# Patient Record
Sex: Female | Born: 1970 | Race: White | Hispanic: No | Marital: Married | Smoking: Current some day smoker
Health system: Southern US, Community
[De-identification: ages and names within clinical notes are randomized; demographics above are authoritative.]

## PROBLEM LIST (undated history)

## (undated) DIAGNOSIS — Z87442 Personal history of urinary calculi: Secondary | ICD-10-CM

## (undated) DIAGNOSIS — K259 Gastric ulcer, unspecified as acute or chronic, without hemorrhage or perforation: Secondary | ICD-10-CM

## (undated) DIAGNOSIS — E669 Obesity, unspecified: Secondary | ICD-10-CM

## (undated) HISTORY — DX: Obesity, unspecified: E66.9

## (undated) HISTORY — DX: Gastric ulcer, unspecified as acute or chronic, without hemorrhage or perforation: K25.9

## (undated) HISTORY — PX: KIDNEY STONE SURGERY: SHX686

## (undated) HISTORY — DX: Personal history of urinary calculi: Z87.442

---

## 2007-02-02 ENCOUNTER — Ambulatory Visit: Payer: Self-pay

## 2007-11-08 ENCOUNTER — Emergency Department: Payer: Self-pay | Admitting: Emergency Medicine

## 2007-11-12 ENCOUNTER — Ambulatory Visit: Payer: Self-pay | Admitting: Urology

## 2011-07-18 ENCOUNTER — Ambulatory Visit: Payer: Self-pay

## 2012-07-03 ENCOUNTER — Ambulatory Visit: Payer: Self-pay | Admitting: Medical

## 2012-07-21 ENCOUNTER — Ambulatory Visit: Payer: Self-pay

## 2012-07-29 ENCOUNTER — Ambulatory Visit: Payer: Self-pay

## 2012-12-17 ENCOUNTER — Ambulatory Visit: Payer: Self-pay | Admitting: Internal Medicine

## 2012-12-17 LAB — RAPID INFLUENZA A&B ANTIGENS

## 2012-12-21 ENCOUNTER — Ambulatory Visit: Payer: Self-pay

## 2013-07-30 ENCOUNTER — Ambulatory Visit: Payer: Self-pay

## 2013-10-20 IMAGING — CR DG CHEST 2V
1 series · 2 of 2 positions shown · non-contrast
Comparison: none

REASON FOR EXAM: cough
COMMENTS:

PROCEDURE:     MDR - MDR CHEST PA(OR AP) AND LATERAL  - December 21, 2012  [DATE]
RESULT:     Comparison: None.

[Series 1: pa · 0.17mm/px · 2 of 2 slices shown]
[im 1/2]
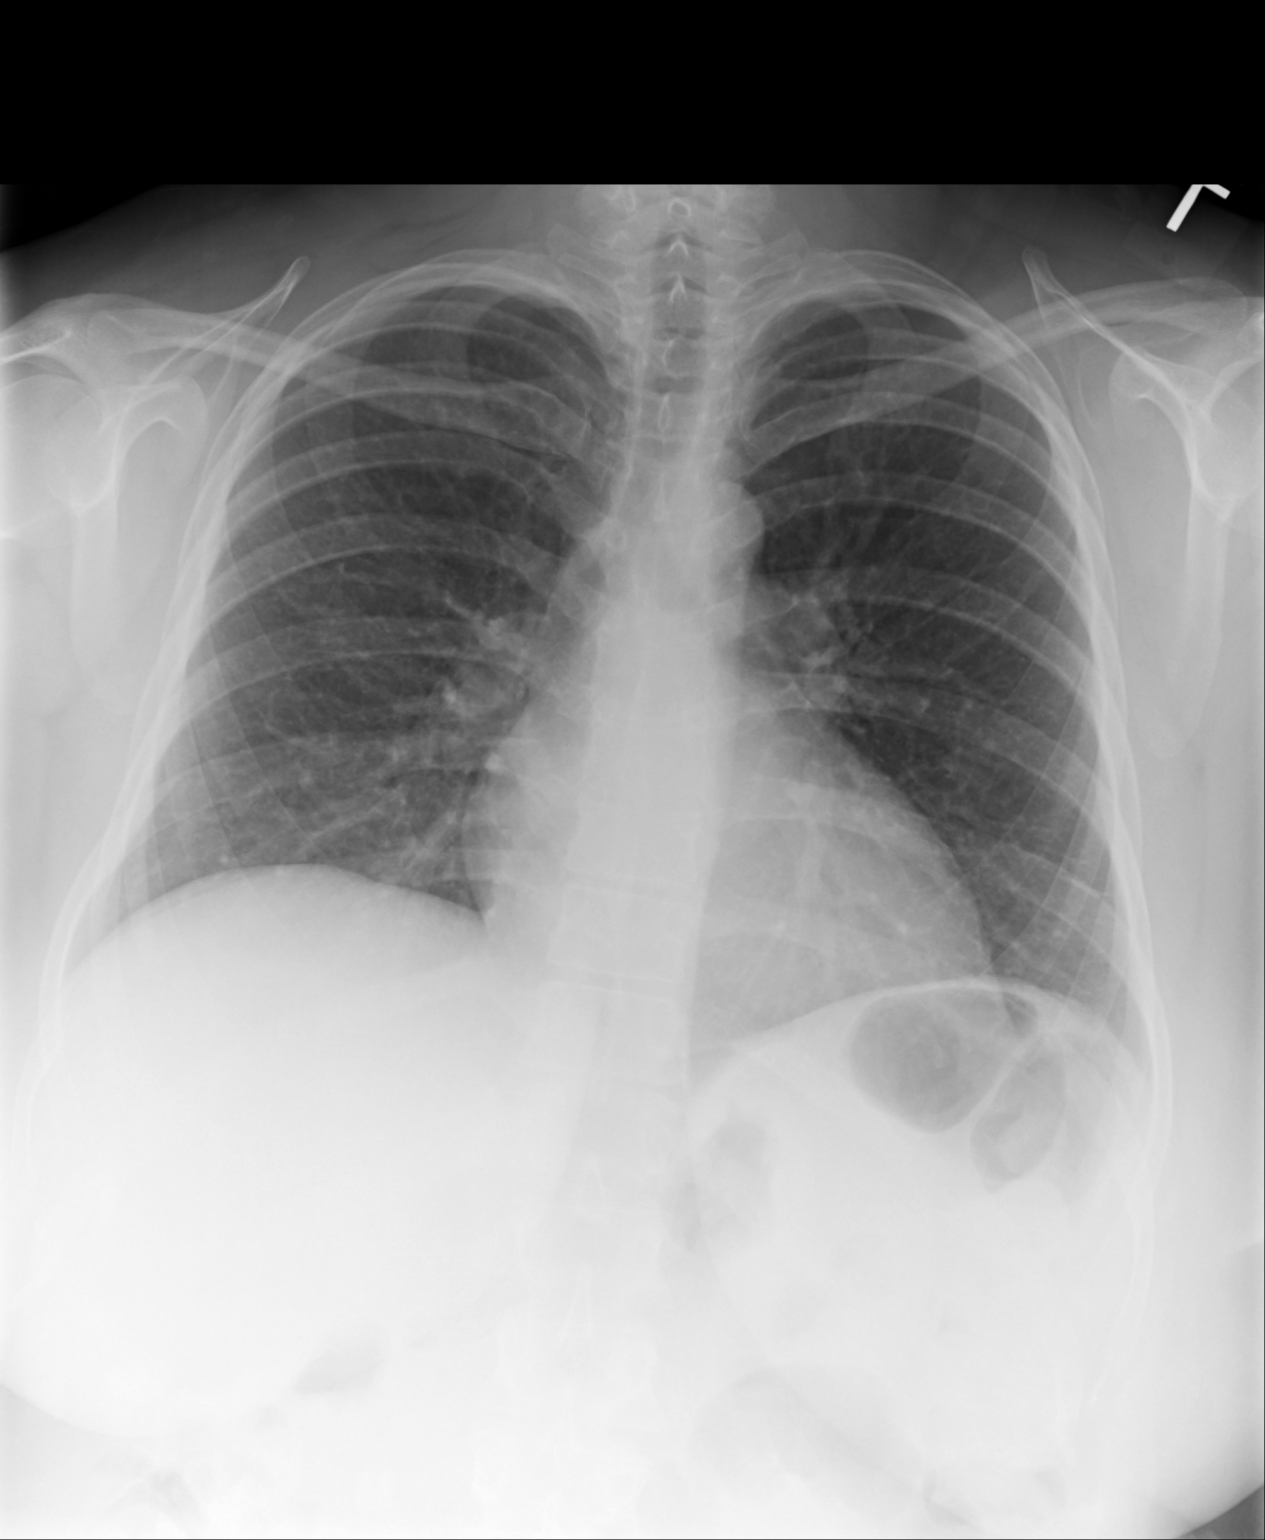
[im 2/2]
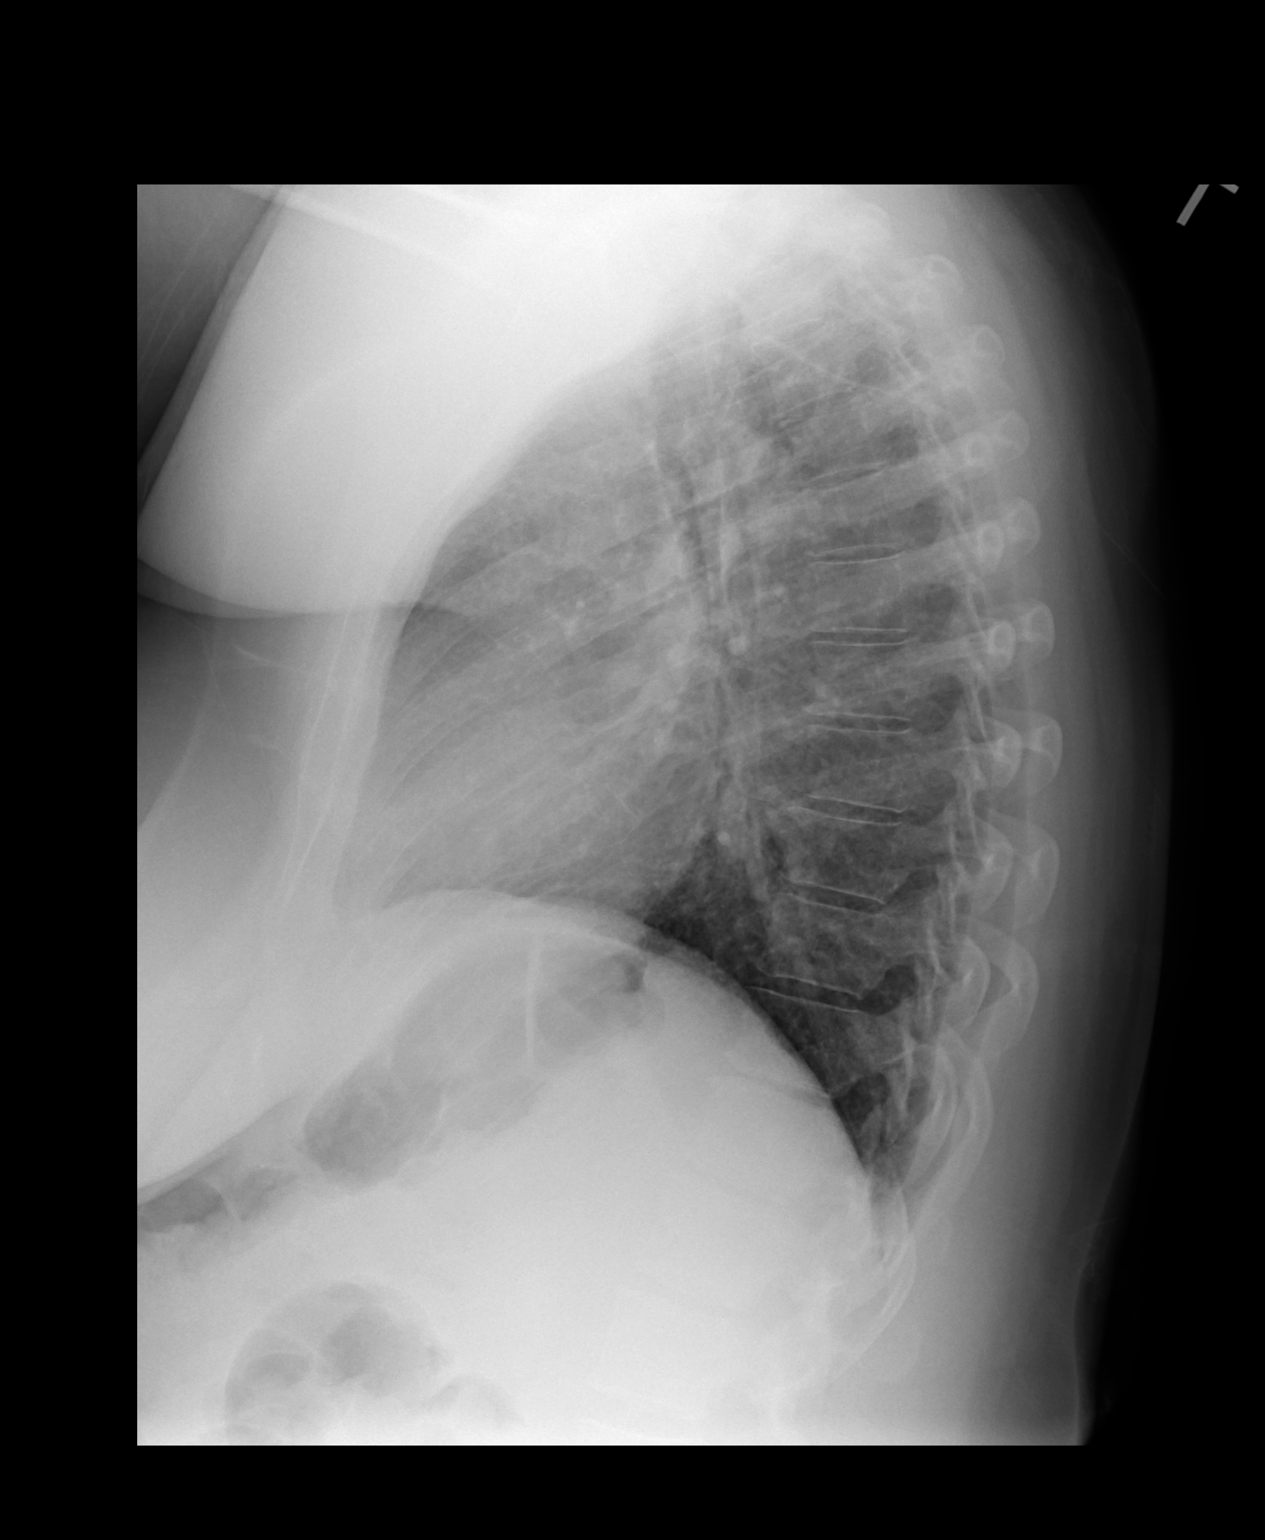

[2 of 2 positions shown; findings below may reference images not displayed]

FINDINGS: Heart size upper limits normal. The lung volumes are slightly diminished.
Minimal basilar opacities are likely secondary to atelectasis.
IMPRESSION: Minimal basilar opacities are likely secondary to atelectasis.

[REDACTED]

## 2014-08-18 ENCOUNTER — Ambulatory Visit: Payer: Self-pay

## 2016-07-18 ENCOUNTER — Ambulatory Visit (INDEPENDENT_AMBULATORY_CARE_PROVIDER_SITE_OTHER): Payer: BC Managed Care – PPO | Admitting: Family Medicine

## 2016-07-18 ENCOUNTER — Encounter: Payer: Self-pay | Admitting: Family Medicine

## 2016-07-18 VITALS — BP 133/84 | HR 98 | Temp 98.9°F | Ht 60.7 in | Wt 240.0 lb

## 2016-07-18 DIAGNOSIS — R5382 Chronic fatigue, unspecified: Secondary | ICD-10-CM | POA: Diagnosis not present

## 2016-07-18 NOTE — Assessment & Plan Note (Signed)
Likely multifactorial with mood and weight playing a factor. Concern for OSA. Will check labs at her physical shortly and obtain PHQ9. If no better and normal labs, consider sleep study. Continue to monitor closely. Call with any concerns.

## 2016-07-18 NOTE — Progress Notes (Signed)
BP 133/84 (BP Location: Left Arm, Patient Position: Sitting, Cuff Size: Large)   Pulse 98   Temp 98.9 F (37.2 C)   Ht 5' 0.7" (1.542 m)   Wt 240 lb (108.9 kg)   LMP 06/28/2016 (Approximate)   SpO2 98%   BMI 45.80 kg/m    Subjective:    Patient ID: Tanya Haas, female    DOB: 03/22/1971, 45 y.o.   MRN: [id number]  HPI: Tanya FendtBethany Jane Scarfone is a 45 y.o. female  Chief Complaint  Patient presents with  . Establish Care   FATIGUE Duration:  Couple of months Severity: moderate  Onset: gradual Context when symptoms started:  Gain a bit of weight Symptoms improve with rest: yes  Depressive symptoms: yes Stress/anxiety: yes-  Insomnia: no  Snoring: yes Observed apnea by bed partner: no Daytime hypersomnolence:no Wakes feeling refreshed: yes History of sleep study: no Dysnea on exertion:  yes Orthopnea/PND: no Chest pain: no Chronic cough: no Lower extremity edema: no Arthralgias:no Myalgias: no Weakness: no Rash: no  Active Ambulatory Problems    Diagnosis Date Noted  . Chronic fatigue 07/18/2016   Resolved Ambulatory Problems    Diagnosis Date Noted  . No Resolved Ambulatory Problems   Past Medical History:  Diagnosis Date  . History of kidney stones   . Obesity   . Stomach ulcer    Past Surgical History:  Procedure Laterality Date  . KIDNEY STONE SURGERY     No outpatient encounter prescriptions on file as of 07/18/2016.   No facility-administered encounter medications on file as of 07/18/2016.    No Known Allergies  Social History   Social History  . Marital status: Married    Spouse name: N/A  . Number of children: N/A  . Years of education: N/A   Occupational History  . Not on file.   Social History Main Topics  . Smoking status: Never Smoker  . Smokeless tobacco: Never Used  . Alcohol use No  . Drug use: No  . Sexual activity: Yes    Birth control/ protection: None, Other-see comments     Comment: Vasectomy for husband    Other Topics Concern  . Not on file   Social History Narrative  . No narrative on file   Family History  Problem Relation Age of Onset  . Cancer Mother     Uterine  . Heart disease Father   . Diabetes Maternal Grandmother   . Heart disease Maternal Grandmother   . Heart disease Paternal Grandmother   . AAA (abdominal aortic aneurysm) Paternal Grandfather    Review of Systems  Constitutional: Positive for fatigue. Negative for activity change, appetite change, chills, diaphoresis, fever and unexpected weight change.  Respiratory: Negative.   Cardiovascular: Negative.   Musculoskeletal: Positive for back pain and myalgias. Negative for arthralgias, gait problem, joint swelling, neck pain and neck stiffness.  Skin: Negative.   Psychiatric/Behavioral: Negative.     Per HPI unless specifically indicated above     Objective:    BP 133/84 (BP Location: Left Arm, Patient Position: Sitting, Cuff Size: Large)   Pulse 98   Temp 98.9 F (37.2 C)   Ht 5' 0.7" (1.542 m)   Wt 240 lb (108.9 kg)   LMP 06/28/2016 (Approximate)   SpO2 98%   BMI 45.80 kg/m   Wt Readings from Last 3 Encounters:  07/18/16 240 lb (108.9 kg)    Physical Exam  Constitutional: She is oriented to person, place, and time. She  appears well-developed and well-nourished. No distress.  HENT:  Head: Normocephalic and atraumatic.  Right Ear: Hearing normal.  Left Ear: Hearing normal.  Nose: Nose normal.  Eyes: Conjunctivae and lids are normal. Right eye exhibits no discharge. Left eye exhibits no discharge. No scleral icterus.  Cardiovascular: Normal rate, regular rhythm, normal heart sounds and intact distal pulses.  Exam reveals no gallop and no friction rub.   No murmur heard. Pulmonary/Chest: Effort normal and breath sounds normal. No respiratory distress. She has no wheezes. She has no rales. She exhibits no tenderness.  Musculoskeletal: Normal range of motion.  Neurological: She is alert and oriented  to person, place, and time.  Skin: Skin is warm, dry and intact. No rash noted. No erythema. No pallor.  Psychiatric: She has a normal mood and affect. Her speech is normal and behavior is normal. Judgment and thought content normal. Cognition and memory are normal.  Nursing note and vitals reviewed.      Assessment & Plan:   Problem List Items Addressed This Visit      Other   Chronic fatigue - Primary    Likely multifactorial with mood and weight playing a factor. Concern for OSA. Will check labs at her physical shortly and obtain PHQ9. If no better and normal labs, consider sleep study. Continue to monitor closely. Call with any concerns.        Other Visit Diagnoses   None.      Follow up plan: Return ASAP, for Physical.

## 2016-07-29 ENCOUNTER — Encounter: Payer: Self-pay | Admitting: Family Medicine

## 2016-07-29 ENCOUNTER — Ambulatory Visit (INDEPENDENT_AMBULATORY_CARE_PROVIDER_SITE_OTHER): Payer: BC Managed Care – PPO | Admitting: Family Medicine

## 2016-07-29 VITALS — BP 105/71 | HR 75 | Temp 98.6°F | Ht 60.5 in | Wt 240.5 lb

## 2016-07-29 DIAGNOSIS — Z23 Encounter for immunization: Secondary | ICD-10-CM | POA: Diagnosis not present

## 2016-07-29 DIAGNOSIS — Z0000 Encounter for general adult medical examination without abnormal findings: Secondary | ICD-10-CM | POA: Diagnosis not present

## 2016-07-29 DIAGNOSIS — R5382 Chronic fatigue, unspecified: Secondary | ICD-10-CM | POA: Diagnosis not present

## 2016-07-29 DIAGNOSIS — Z1231 Encounter for screening mammogram for malignant neoplasm of breast: Secondary | ICD-10-CM

## 2016-07-29 DIAGNOSIS — Z13220 Encounter for screening for lipoid disorders: Secondary | ICD-10-CM

## 2016-07-29 DIAGNOSIS — Z1239 Encounter for other screening for malignant neoplasm of breast: Secondary | ICD-10-CM

## 2016-07-29 NOTE — Assessment & Plan Note (Addendum)
Likely multifactorial with mood and weight playing a factor. Concern for OSA. Will check labs. If no better and normal labs, consider sleep study. Continue to monitor closely. Call with any concerns.

## 2016-07-29 NOTE — Progress Notes (Signed)
BP 105/71 (BP Location: Left Arm, Patient Position: Sitting, Cuff Size: Large)   Pulse 75   Temp 98.6 F (37 C)   Ht 5' 0.5" (1.537 m)   Wt 240 lb 8 oz (109.1 kg)   LMP 07/25/2016 (Exact Date)   SpO2 95%   BMI 46.20 kg/m    Subjective:    Patient ID: Tanya Haas, female    DOB: 03/11/1971, 45 y.o.   MRN: [id number]  HPI: Tanya FendtBethany Jane Pam is a 45 y.o. female presenting on 07/29/2016 for comprehensive medical examination. Current medical complaints include: Continues to be really tired and stressed about son's addiction.  She currently lives with: husband and kids Menopausal Symptoms: no  Depression Screen done today and results listed below:  Depression screen PHQ 2/9 07/29/2016  Decreased Interest 1  Down, Depressed, Hopeless 0  PHQ - 2 Score 1    Past Medical History:  Past Medical History:  Diagnosis Date  . History of kidney stones   . Obesity   . Stomach ulcer     Surgical History:  Past Surgical History:  Procedure Laterality Date  . KIDNEY STONE SURGERY     Medications:  No current outpatient prescriptions on file prior to visit.   No current facility-administered medications on file prior to visit.    Allergies:  No Known Allergies  Social History:  Social History   Social History  . Marital status: Married    Spouse name: N/A  . Number of children: N/A  . Years of education: N/A   Occupational History  . Not on file.   Social History Main Topics  . Smoking status: Never Smoker  . Smokeless tobacco: Never Used  . Alcohol use No  . Drug use: No  . Sexual activity: Yes    Birth control/ protection: None, Other-see comments     Comment: Vasectomy for husband   Other Topics Concern  . Not on file   Social History Narrative  . No narrative on file   History  Smoking Status  . Never Smoker  Smokeless Tobacco  . Never Used   History  Alcohol Use No   Family History:  Family History  Problem Relation Age of Onset  .  Cancer Mother     Uterine  . Heart disease Father   . Diabetes Maternal Grandmother   . Heart disease Maternal Grandmother   . Heart disease Paternal Grandmother   . AAA (abdominal aortic aneurysm) Paternal Grandfather    Past medical history, surgical history, medications, allergies, family history and social history reviewed with patient today and changes made to appropriate areas of the chart.   Review of Systems  Constitutional: Positive for malaise/fatigue. Negative for chills, diaphoresis, fever and weight loss.  HENT: Negative.   Eyes: Negative.   Respiratory: Negative.   Cardiovascular: Negative.   Gastrointestinal: Negative.   Genitourinary: Negative.   Musculoskeletal: Negative.   Skin: Negative.   Neurological: Negative.  Negative for weakness.  Endo/Heme/Allergies: Negative.   Psychiatric/Behavioral: Negative for depression, hallucinations, memory loss, substance abuse and suicidal ideas. The patient is nervous/anxious. The patient does not have insomnia.     All other ROS negative except what is listed above and in the HPI.     Objective:    BP 105/71 (BP Location: Left Arm, Patient Position: Sitting, Cuff Size: Large)   Pulse 75   Temp 98.6 F (37 C)   Ht 5' 0.5" (1.537 m)   Wt 240 lb 8 oz (  109.1 kg)   LMP 07/25/2016 (Exact Date)   SpO2 95%   BMI 46.20 kg/m   Wt Readings from Last 3 Encounters:  07/29/16 240 lb 8 oz (109.1 kg)  07/18/16 240 lb (108.9 kg)    Physical Exam  Constitutional: She is oriented to person, place, and time. She appears well-developed and well-nourished. No distress.  HENT:  Head: Normocephalic and atraumatic.  Right Ear: Hearing, tympanic membrane, external ear and ear canal normal.  Left Ear: Hearing, tympanic membrane, external ear and ear canal normal.  Nose: Nose normal.  Mouth/Throat: Uvula is midline, oropharynx is clear and moist and mucous membranes are normal. No oropharyngeal exudate.  Eyes: Conjunctivae, EOM and lids  are normal. Pupils are equal, round, and reactive to light. Right eye exhibits no discharge. Left eye exhibits no discharge. No scleral icterus.  Neck: Normal range of motion. Neck supple. No JVD present. No tracheal deviation present. No thyromegaly present.  Cardiovascular: Normal rate, regular rhythm, normal heart sounds and intact distal pulses.  Exam reveals no gallop and no friction rub.   No murmur heard. Pulmonary/Chest: Effort normal and breath sounds normal. No stridor. No respiratory distress. She has no wheezes. She has no rales. She exhibits no tenderness. Right breast exhibits no inverted nipple, no mass, no nipple discharge, no skin change and no tenderness. Left breast exhibits no inverted nipple, no mass, no nipple discharge, no skin change and no tenderness. Breasts are symmetrical.  Abdominal: Soft. Bowel sounds are normal. She exhibits no distension and no mass. There is no tenderness. There is no rebound and no guarding.  Genitourinary:  Genitourinary Comments: GYN exam deferred with shared decision making.   Musculoskeletal: Normal range of motion. She exhibits no edema, tenderness or deformity.  Lymphadenopathy:    She has no cervical adenopathy.  Neurological: She is alert and oriented to person, place, and time. She has normal reflexes. She displays normal reflexes. No cranial nerve deficit. She exhibits normal muscle tone. Coordination normal.  Skin: Skin is warm, dry and intact. No rash noted. She is not diaphoretic. No erythema. No pallor.  Psychiatric: She has a normal mood and affect. Her speech is normal and behavior is normal. Judgment and thought content normal. Cognition and memory are normal.  Nursing note and vitals reviewed.     Assessment & Plan:   Problem List Items Addressed This Visit      Other   Chronic fatigue    Likely multifactorial with mood and weight playing a factor. Concern for OSA. Will check labs. If no better and normal labs, consider sleep  study. Continue to monitor closely. Call with any concerns.       Relevant Orders   CBC with Differential/Platelet   Bayer DCA Hb A1c Waived   Comprehensive metabolic panel   Thyroid Panel With TSH   Lyme Ab/Western Blot Reflex   VITAMIN D 25 Hydroxy (Vit-D Deficiency, Fractures)   Rocky mtn spotted fvr abs pnl(IgG+IgM)   Babesia microti Antibody Panel   Ehrlichia Antibody Panel    Other Visit Diagnoses    Routine general medical examination at a health care facility    -  Primary   Vaccines updated/declined. Screening labs checked today. Pap due next year. Mammogram ordered today. Work on diet and exercise. Call with any concerns.    Relevant Orders   CBC with Differential/Platelet   Bayer DCA Hb A1c Waived   Comprehensive metabolic panel   Lipid Panel w/o Chol/HDL Ratio   UA/M  w/rflx Culture, Routine   Thyroid Panel With TSH   Screening for cholesterol level       Screening labs checked today. Await results.    Relevant Orders   Lipid Panel w/o Chol/HDL Ratio   Screening for breast cancer       Mammogram ordered today.   Relevant Orders   MM DIGITAL SCREENING BILATERAL   Immunization due       Tdap given today. flu declined.       Follow up plan: Return Pending labs .   LABORATORY TESTING:  - Pap smear: up to date  IMMUNIZATIONS:   - Tdap: Tetanus vaccination status reviewed: Tdap vaccination indicated and given today. - Influenza: Refused - Pneumovax: Not applicable  SCREENING: -Mammogram: Ordered today  - Colonoscopy: Not applicable   PATIENT COUNSELING:   Advised to take 1 mg of folate supplement per day if capable of pregnancy.   Sexuality: Discussed sexually transmitted diseases, partner selection, use of condoms, avoidance of unintended pregnancy  and contraceptive alternatives.   Advised to avoid cigarette smoking.  I discussed with the patient that most people either abstain from alcohol or drink within safe limits (<=14/week and <=4  drinks/occasion for males, <=7/weeks and <= 3 drinks/occasion for females) and that the risk for alcohol disorders and other health effects rises proportionally with the number of drinks per week and how often a drinker exceeds daily limits.  Discussed cessation/primary prevention of drug use and availability of treatment for abuse.   Diet: Encouraged to adjust caloric intake to maintain  or achieve ideal body weight, to reduce intake of dietary saturated fat and total fat, to limit sodium intake by avoiding high sodium foods and not adding table salt, and to maintain adequate dietary potassium and calcium preferably from fresh fruits, vegetables, and low-fat dairy products.    stressed the importance of regular exercise  Injury prevention: Discussed safety belts, safety helmets, smoke detector, smoking near bedding or upholstery.   Dental health: Discussed importance of regular tooth brushing, flossing, and dental visits.    NEXT PREVENTATIVE PHYSICAL DUE IN 1 YEAR. Return Pending labs .

## 2016-07-29 NOTE — Patient Instructions (Addendum)
Health Maintenance, Female Adopting a healthy lifestyle and getting preventive care can go a long way to promote health and wellness. Talk with your health care provider about what schedule of regular examinations is right for you. This is a good chance for you to check in with your provider about disease prevention and staying healthy. In between checkups, there are plenty of things you can do on your own. Experts have done a lot of research about which lifestyle changes and preventive measures are most likely to keep you healthy. Ask your health care provider for more information. WEIGHT AND DIET  Eat a healthy diet  Be sure to include plenty of vegetables, fruits, low-fat dairy products, and lean protein.  Do not eat a lot of foods high in solid fats, added sugars, or salt.  Get regular exercise. This is one of the most important things you can do for your health.  Most adults should exercise for at least 150 minutes each week. The exercise should increase your heart rate and make you sweat (moderate-intensity exercise).  Most adults should also do strengthening exercises at least twice a week. This is in addition to the moderate-intensity exercise.  Maintain a healthy weight  Body mass index (BMI) is a measurement that can be used to identify possible weight problems. It estimates body fat based on height and weight. Your health care provider can help determine your BMI and help you achieve or maintain a healthy weight.  For females 20 years of age and older:   A BMI below 18.5 is considered underweight.  A BMI of 18.5 to 24.9 is normal.  A BMI of 25 to 29.9 is considered overweight.  A BMI of 30 and above is considered obese.  Watch levels of cholesterol and blood lipids  You should start having your blood tested for lipids and cholesterol at 45 years of age, then have this test every 5 years.  You may need to have your cholesterol levels checked more often if:  Your lipid  or cholesterol levels are high.  You are older than 45 years of age.  You are at high risk for heart disease.  CANCER SCREENING   Lung Cancer  Lung cancer screening is recommended for adults 55-80 years old who are at high risk for lung cancer because of a history of smoking.  A yearly low-dose CT scan of the lungs is recommended for people who:  Currently smoke.  Have quit within the past 15 years.  Have at least a 30-pack-year history of smoking. A pack year is smoking an average of one pack of cigarettes a day for 1 year.  Yearly screening should continue until it has been 15 years since you quit.  Yearly screening should stop if you develop a health problem that would prevent you from having lung cancer treatment.  Breast Cancer  Practice breast self-awareness. This means understanding how your breasts normally appear and feel.  It also means doing regular breast self-exams. Let your health care provider know about any changes, no matter how small.  If you are in your 20s or 30s, you should have a clinical breast exam (CBE) by a health care provider every 1-3 years as part of a regular health exam.  If you are 40 or older, have a CBE every year. Also consider having a breast X-ray (mammogram) every year.  If you have a family history of breast cancer, talk to your health care provider about genetic screening.  If you   are at high risk for breast cancer, talk to your health care provider about having an MRI and a mammogram every year.  Breast cancer gene (BRCA) assessment is recommended for women who have family members with BRCA-related cancers. BRCA-related cancers include:  Breast.  Ovarian.  Tubal.  Peritoneal cancers.  Results of the assessment will determine the need for genetic counseling and BRCA1 and BRCA2 testing. Cervical Cancer Your health care provider may recommend that you be screened regularly for cancer of the pelvic organs (ovaries, uterus, and  vagina). This screening involves a pelvic examination, including checking for microscopic changes to the surface of your cervix (Pap test). You may be encouraged to have this screening done every 3 years, beginning at age 21.  For women ages 30-65, health care providers may recommend pelvic exams and Pap testing every 3 years, or they may recommend the Pap and pelvic exam, combined with testing for human papilloma virus (HPV), every 5 years. Some types of HPV increase your risk of cervical cancer. Testing for HPV may also be done on women of any age with unclear Pap test results.  Other health care providers may not recommend any screening for nonpregnant women who are considered low risk for pelvic cancer and who do not have symptoms. Ask your health care provider if a screening pelvic exam is right for you.  If you have had past treatment for cervical cancer or a condition that could lead to cancer, you need Pap tests and screening for cancer for at least 20 years after your treatment. If Pap tests have been discontinued, your risk factors (such as having a new sexual partner) need to be reassessed to determine if screening should resume. Some women have medical problems that increase the chance of getting cervical cancer. In these cases, your health care provider may recommend more frequent screening and Pap tests. Colorectal Cancer  This type of cancer can be detected and often prevented.  Routine colorectal cancer screening usually begins at 45 years of age and continues through 45 years of age.  Your health care provider may recommend screening at an earlier age if you have risk factors for colon cancer.  Your health care provider may also recommend using home test kits to check for hidden blood in the stool.  A small camera at the end of a tube can be used to examine your colon directly (sigmoidoscopy or colonoscopy). This is done to check for the earliest forms of colorectal  cancer.  Routine screening usually begins at age 50.  Direct examination of the colon should be repeated every 5-10 years through 45 years of age. However, you may need to be screened more often if early forms of precancerous polyps or small growths are found. Skin Cancer  Check your skin from head to toe regularly.  Tell your health care provider about any new moles or changes in moles, especially if there is a change in a mole's shape or color.  Also tell your health care provider if you have a mole that is larger than the size of a pencil eraser.  Always use sunscreen. Apply sunscreen liberally and repeatedly throughout the day.  Protect yourself by wearing long sleeves, pants, a wide-brimmed hat, and sunglasses whenever you are outside. HEART DISEASE, DIABETES, AND HIGH BLOOD PRESSURE   High blood pressure causes heart disease and increases the risk of stroke. High blood pressure is more likely to develop in:  People who have blood pressure in the high end   of the normal range (130-139/85-89 mm Hg).  People who are overweight or obese.  People who are African American.  If you are 38-23 years of age, have your blood pressure checked every 3-5 years. If you are 61 years of age or older, have your blood pressure checked every year. You should have your blood pressure measured twice--once when you are at a hospital or clinic, and once when you are not at a hospital or clinic. Record the average of the two measurements. To check your blood pressure when you are not at a hospital or clinic, you can use:  An automated blood pressure machine at a pharmacy.  A home blood pressure monitor.  If you are between 45 years and 39 years old, ask your health care provider if you should take aspirin to prevent strokes.  Have regular diabetes screenings. This involves taking a blood sample to check your fasting blood sugar level.  If you are at a normal weight and have a low risk for diabetes,  have this test once every three years after 45 years of age.  If you are overweight and have a high risk for diabetes, consider being tested at a younger age or more often. PREVENTING INFECTION  Hepatitis B  If you have a higher risk for hepatitis B, you should be screened for this virus. You are considered at high risk for hepatitis B if:  You were born in a country where hepatitis B is common. Ask your health care provider which countries are considered high risk.  Your parents were born in a high-risk country, and you have not been immunized against hepatitis B (hepatitis B vaccine).  You have HIV or AIDS.  You use needles to inject street drugs.  You live with someone who has hepatitis B.  You have had sex with someone who has hepatitis B.  You get hemodialysis treatment.  You take certain medicines for conditions, including cancer, organ transplantation, and autoimmune conditions. Hepatitis C  Blood testing is recommended for:  Everyone born from [birth year] through 1965.  Anyone with known risk factors for hepatitis C. Sexually transmitted infections (STIs)  You should be screened for sexually transmitted infections (STIs) including gonorrhea and chlamydia if:  You are sexually active and are younger than 45 years of age.  You are older than 45 years of age and your health care provider tells you that you are at risk for this type of infection.  Your sexual activity has changed since you were last screened and you are at an increased risk for chlamydia or gonorrhea. Ask your health care provider if you are at risk.  If you do not have HIV, but are at risk, it may be recommended that you take a prescription medicine daily to prevent HIV infection. This is called pre-exposure prophylaxis (PrEP). You are considered at risk if:  You are sexually active and do not regularly use condoms or know the HIV status of your partner(s).  You take drugs by injection.  You are sexually  active with a partner who has HIV. Talk with your health care provider about whether you are at high risk of being infected with HIV. If you choose to begin PrEP, you should first be tested for HIV. You should then be tested every 3 months for as long as you are taking PrEP.  PREGNANCY   If you are premenopausal and you may become pregnant, ask your health care provider about preconception counseling.  If you may  become pregnant, take 400 to 800 micrograms (mcg) of folic acid every day.  If you want to prevent pregnancy, talk to your health care provider about birth control (contraception). OSTEOPOROSIS AND MENOPAUSE   Osteoporosis is a disease in which the bones lose minerals and strength with aging. This can result in serious bone fractures. Your risk for osteoporosis can be identified using a bone density scan.  If you are 11 years of age or older, or if you are at risk for osteoporosis and fractures, ask your health care provider if you should be screened.  Ask your health care provider whether you should take a calcium or vitamin D supplement to lower your risk for osteoporosis.  Menopause may have certain physical symptoms and risks.  Hormone replacement therapy may reduce some of these symptoms and risks. Talk to your health care provider about whether hormone replacement therapy is right for you.  HOME CARE INSTRUCTIONS   Schedule regular health, dental, and eye exams.  Stay current with your immunizations.   Do not use any tobacco products including cigarettes, chewing tobacco, or electronic cigarettes.  If you are pregnant, do not drink alcohol.  If you are breastfeeding, limit how much and how often you drink alcohol.  Limit alcohol intake to no more than 1 drink per day for nonpregnant women. One drink equals 12 ounces of beer, 5 ounces of wine, or 1 ounces of hard liquor.  Do not use street drugs.  Do not share needles.  Ask your health care provider for help if  you need support or information about quitting drugs.  Tell your health care provider if you often feel depressed.  Tell your health care provider if you have ever been abused or do not feel safe at home.   This information is not intended to replace advice given to you by your health care provider. Make sure you discuss any questions you have with your health care provider.   Document Released: 04/01/2011 Document Revised: 10/07/2014 Document Reviewed: 08/18/2013 Elsevier Interactive Patient Education 2016 [employer]. Fatigue Fatigue is feeling tired all of the time, a lack of energy, or a lack of motivation. Occasional or mild fatigue is often a normal response to activity or life in general. However, long-lasting (chronic) or extreme fatigue may indicate an underlying medical condition. HOME CARE INSTRUCTIONS  Watch your fatigue for any changes. The following actions may help to lessen any discomfort you are feeling:  Talk to your health care provider about how much sleep you need each night. Try to get the required amount every night.  Take medicines only as directed by your health care provider.  Eat a healthy and nutritious diet. Ask your health care provider if you need help changing your diet.  Drink enough fluid to keep your urine clear or pale yellow.  Practice ways of relaxing, such as yoga, meditation, massage therapy, or acupuncture.  Exercise regularly.   Change situations that cause you stress. Try to keep your work and personal routine reasonable.  Do not abuse illegal drugs.  Limit alcohol intake to no more than 1 drink per day for nonpregnant women and 2 drinks per day for men. One drink equals 12 ounces of beer, 5 ounces of wine, or 1 ounces of hard liquor.  Take a multivitamin, if directed by your health care provider. SEEK MEDICAL CARE IF:   Your fatigue does not get better.  You have a fever.   You have unintentional weight loss or gain.  You have  headaches.   You have difficulty:   Falling asleep.  Sleeping throughout the night.  You feel angry, guilty, anxious, or sad.   You are unable to have a bowel movement (constipation).   You skin is dry.   Your legs or another part of your body is swollen.  SEEK IMMEDIATE MEDICAL CARE IF:   You feel confused.   Your vision is blurry.  You feel faint or pass out.   You have a severe headache.   You have severe abdominal, pelvic, or back pain.   You have chest pain, shortness of breath, or an irregular or fast heartbeat.   You are unable to urinate or you urinate less than normal.   You develop abnormal bleeding, such as bleeding from the rectum, vagina, nose, lungs, or nipples.  You vomit blood.   You have thoughts about harming yourself or committing suicide.   You are worried that you might harm someone else.    This information is not intended to replace advice given to you by your health care provider. Make sure you discuss any questions you have with your health care provider.   Document Released: 07/14/2007 Document Revised: 10/07/2014 Document Reviewed: 01/18/2014 Elsevier Interactive Patient Education 2016 [employer]. Tdap Vaccine (Tetanus, Diphtheria and Pertussis): What You Need to Know 1. Why get vaccinated? Tetanus, diphtheria and pertussis are very serious diseases. Tdap vaccine can protect [place] from these diseases. And, Tdap vaccine given to pregnant women can protect newborn babies against pertussis. TETANUS (Lockjaw) is rare in the [place] today. It causes painful muscle tightening and stiffness, usually all over the body.  It can lead to tightening of muscles in the head and neck so you can't open your mouth, swallow, or sometimes even breathe. Tetanus kills about 1 out of 10 people who are infected even after receiving the best medical care. DIPHTHERIA is also rare in the [place] today. It can cause a thick coating to  form in the back of the throat.  It can lead to breathing problems, heart failure, paralysis, and death. PERTUSSIS (Whooping Cough) causes severe coughing spells, which can cause difficulty breathing, vomiting and disturbed sleep.  It can also lead to weight loss, incontinence, and rib fractures. Up to 2 in 100 adolescents and 5 in 100 adults with pertussis are hospitalized or have complications, which could include pneumonia or death. These diseases are caused by bacteria. Diphtheria and pertussis are spread from person to person through secretions from coughing or sneezing. Tetanus enters the body through cuts, scratches, or wounds. Before vaccines, as many as 200,000 cases of diphtheria, 200,000 cases of pertussis, and hundreds of cases of tetanus, were reported in the [place] each year. Since vaccination began, reports of cases for tetanus and diphtheria have dropped by about 99% and for pertussis by about 80%. 2. Tdap vaccine Tdap vaccine can protect adolescents and adults from tetanus, diphtheria, and pertussis. One dose of Tdap is routinely given at age 6 or 28. People who did not get Tdap at that age should get it as soon as possible. Tdap is especially important for healthcare professionals and anyone having close contact with a baby younger than 12 months. Pregnant women should get a dose of Tdap during every pregnancy, to protect the newborn from pertussis. Infants are most at risk for severe, life-threatening complications from pertussis. Another vaccine, called Td, protects against tetanus and diphtheria, but not pertussis. A Td booster should be given every 10  years. Tdap may be given as one of these boosters if you have never gotten Tdap before. Tdap may also be given after a severe cut or burn to prevent tetanus infection. Your doctor or the person giving you the vaccine can give you more information. Tdap may safely be given at the same time as other vaccines. 3. Some people  should not get this vaccine  A person who has ever had a life-threatening allergic reaction after a previous dose of any diphtheria, tetanus or pertussis containing vaccine, OR has a severe allergy to any part of this vaccine, should not get Tdap vaccine. Tell the person giving the vaccine about any severe allergies.  Anyone who had coma or long repeated seizures within 7 days after a childhood dose of DTP or DTaP, or a previous dose of Tdap, should not get Tdap, unless a cause other than the vaccine was found. They can still get Td.  Talk to your doctor if you:  have seizures or another nervous system problem,  had severe pain or swelling after any vaccine containing diphtheria, tetanus or pertussis,  ever had a condition called Guillain-Barr Syndrome (GBS),  aren't feeling well on the day the shot is scheduled. 4. Risks With any medicine, including vaccines, there is a chance of side effects. These are usually mild and go away on their own. Serious reactions are also possible but are rare. Most people who get Tdap vaccine do not have any problems with it. Mild problems following Tdap (Did not interfere with activities)  Pain where the shot was given (about 3 in 4 adolescents or 2 in 3 adults)  Redness or swelling where the shot was given (about 1 person in 5)  Mild fever of at least 100.41F (up to about 1 in 25 adolescents or 1 in 100 adults)  Headache (about 3 or 4 people in 10)  Tiredness (about 1 person in 3 or 4)  Nausea, vomiting, diarrhea, stomach ache (up to 1 in 4 adolescents or 1 in 10 adults)  Chills, sore joints (about 1 person in 10)  Body aches (about 1 person in 3 or 4)  Rash, swollen glands (uncommon) Moderate problems following Tdap (Interfered with activities, but did not require medical attention)  Pain where the shot was given (up to 1 in 5 or 6)  Redness or swelling where the shot was given (up to about 1 in 16 adolescents or 1 in 12 adults)  Fever  over 102F (about 1 in 100 adolescents or 1 in 250 adults)  Headache (about 1 in 7 adolescents or 1 in 10 adults)  Nausea, vomiting, diarrhea, stomach ache (up to 1 or 3 people in 100)  Swelling of the entire arm where the shot was given (up to about 1 in 500). Severe problems following Tdap (Unable to perform usual activities; required medical attention)  Swelling, severe pain, bleeding and redness in the arm where the shot was given (rare). Problems that could happen after any vaccine:  People sometimes faint after a medical procedure, including vaccination. Sitting or lying down for about 15 minutes can help prevent fainting, and injuries caused by a fall. Tell your doctor if you feel dizzy, or have vision changes or ringing in the ears.  Some people get severe pain in the shoulder and have difficulty moving the arm where a shot was given. This happens very rarely.  Any medication can cause a severe allergic reaction. Such reactions from a vaccine are very rare, estimated at  fewer than 1 in a million doses, and would happen within a few minutes to a few hours after the vaccination. As with any medicine, there is a very remote chance of a vaccine causing a serious injury or death. The safety of vaccines is always being monitored. For more information, visit: http://www.aguilar.org/ 5. What if there is a serious problem? What should I look for?  Look for anything that concerns you, such as signs of a severe allergic reaction, very high fever, or unusual behavior.  Signs of a severe allergic reaction can include hives, swelling of the face and throat, difficulty breathing, a fast heartbeat, dizziness, and weakness. These would usually start a few minutes to a few hours after the vaccination. What should I do?  If you think it is a severe allergic reaction or other emergency that can't wait, call 9-1-1 or get the person to the nearest hospital. Otherwise, call your doctor.  Afterward,  the reaction should be reported to the Vaccine Adverse Event Reporting System (VAERS). Your doctor might file this report, or you can do it yourself through the VAERS web site at www.vaers.SamedayNews.es, or by calling 9401037939. VAERS does not give medical advice.  6. The National Vaccine Injury Compensation Program The [employer] Vaccine Injury Compensation Program (VICP) is a federal program that was created to compensate people who may have been injured by certain vaccines. Persons who believe they may have been injured by a vaccine can learn about the program and about filing a claim by calling 317-097-4826 or visiting the [place] website at GoldCloset.com.ee. There is a time limit to file a claim for compensation. 7. How can I learn more?  Ask your doctor. He or she can give you the vaccine package insert or suggest other sources of information.  Call your local or state health department.  Contact the Centers for Disease Control and Prevention (CDC):  Call 219 675 7044 (1-800-CDC-INFO) or  Visit CDC's website at http://hunter.com/ CDC Tdap Vaccine VIS (11/23/13)   This information is not intended to replace advice given to you by your health care provider. Make sure you discuss any questions you have with your health care provider.   Document Released: 03/17/2012 Document Revised: 10/07/2014 Document Reviewed: 12/29/2013 Elsevier Interactive Patient Education [employer].

## 2016-07-31 ENCOUNTER — Telehealth: Payer: Self-pay | Admitting: Family Medicine

## 2016-07-31 DIAGNOSIS — R5382 Chronic fatigue, unspecified: Secondary | ICD-10-CM

## 2016-07-31 LAB — COMPREHENSIVE METABOLIC PANEL
ALK PHOS: 50 IU/L (ref 39–117)
ALT: 11 IU/L (ref 0–32)
AST: 12 IU/L (ref 0–40)
Albumin/Globulin Ratio: 1.8 (ref 1.2–2.2)
Albumin: 4.3 g/dL (ref 3.5–5.5)
BUN/Creatinine Ratio: 16 (ref 9–23)
BUN: 14 mg/dL (ref 6–24)
Bilirubin Total: 0.7 mg/dL (ref 0.0–1.2)
CO2: 27 mmol/L (ref 18–29)
CREATININE: 0.86 mg/dL (ref 0.57–1.00)
Calcium: 9.4 mg/dL (ref 8.7–10.2)
Chloride: 99 mmol/L (ref 96–106)
GFR calc Af Amer: 94 mL/min/{1.73_m2} (ref >59)
GFR calc non Af Amer: 82 mL/min/{1.73_m2} (ref >59)
GLUCOSE: 88 mg/dL (ref 65–99)
Globulin, Total: 2.4 g/dL (ref 1.5–4.5)
Potassium: 4.8 mmol/L (ref 3.5–5.2)
SODIUM: 140 mmol/L (ref 134–144)
Total Protein: 6.7 g/dL (ref 6.0–8.5)

## 2016-07-31 LAB — CBC WITH DIFFERENTIAL/PLATELET
BASOS ABS: 0 10*3/uL (ref 0.0–0.2)
Basos: 0 %
EOS (ABSOLUTE): 0.1 10*3/uL (ref 0.0–0.4)
EOS: 2 %
HEMATOCRIT: 36 % (ref 34.0–46.6)
Hemoglobin: 12 g/dL (ref 11.1–15.9)
Immature Grans (Abs): 0 10*3/uL (ref 0.0–0.1)
Immature Granulocytes: 0 %
LYMPHS ABS: 2.1 10*3/uL (ref 0.7–3.1)
Lymphs: 28 %
MCH: 29.5 pg (ref 26.6–33.0)
MCHC: 33.3 g/dL (ref 31.5–35.7)
MCV: 89 fL (ref 79–97)
MONOCYTES: 5 %
MONOS ABS: 0.3 10*3/uL (ref 0.1–0.9)
NEUTROS ABS: 4.8 10*3/uL (ref 1.4–7.0)
Neutrophils: 65 %
Platelets: 387 10*3/uL — ABNORMAL HIGH (ref 150–379)
RBC: 4.07 x10E6/uL (ref 3.77–5.28)
RDW: 13.1 % (ref 12.3–15.4)
WBC: 7.4 10*3/uL (ref 3.4–10.8)

## 2016-07-31 LAB — BABESIA MICROTI ANTIBODY PANEL: Babesia microti IgM: <1:10 {titer}

## 2016-07-31 LAB — LIPID PANEL W/O CHOL/HDL RATIO
CHOLESTEROL TOTAL: 193 mg/dL (ref 100–199)
HDL: 48 mg/dL (ref >39)
LDL CALC: 92 mg/dL (ref 0–99)
TRIGLYCERIDES: 263 mg/dL — AB (ref 0–149)
VLDL Cholesterol Cal: 53 mg/dL — ABNORMAL HIGH (ref 5–40)

## 2016-07-31 LAB — UA/M W/RFLX CULTURE, ROUTINE
Bilirubin, UA: NEGATIVE
GLUCOSE, UA: NEGATIVE
Ketones, UA: NEGATIVE
Nitrite, UA: NEGATIVE
Protein, UA: NEGATIVE
SPEC GRAV UA: 1.025 (ref 1.005–1.030)
Urobilinogen, Ur: 1 mg/dL (ref 0.2–1.0)
pH, UA: 6.5 (ref 5.0–7.5)

## 2016-07-31 LAB — EHRLICHIA ANTIBODY PANEL
E. Chaffeensis (HME) IgM Titer: NEGATIVE
E.Chaffeensis (HME) IgG: NEGATIVE
HGE IGM TITER: NEGATIVE
HGE IgG Titer: NEGATIVE

## 2016-07-31 LAB — URINE CULTURE, REFLEX

## 2016-07-31 LAB — MICROSCOPIC EXAMINATION

## 2016-07-31 LAB — THYROID PANEL WITH TSH
FREE THYROXINE INDEX: 2 (ref 1.2–4.9)
T3 Uptake Ratio: 25 % (ref 24–39)
T4, Total: 7.9 ug/dL (ref 4.5–12.0)
TSH: 2.43 u[IU]/mL (ref 0.450–4.500)

## 2016-07-31 LAB — VITAMIN D 25 HYDROXY (VIT D DEFICIENCY, FRACTURES): VIT D 25 HYDROXY: 19.7 ng/mL — AB (ref 30.0–100.0)

## 2016-07-31 LAB — LYME AB/WESTERN BLOT REFLEX
LYME DISEASE AB, QUANT, IGM: <0.8 index (ref 0.00–0.79)
Lyme IgG/IgM Ab: <0.91 {ISR} (ref 0.00–0.90)

## 2016-07-31 LAB — ROCKY MTN SPOTTED FVR ABS PNL(IGG+IGM)
RMSF IGG: NEGATIVE
RMSF IgM: 0.43 index (ref 0.00–0.89)

## 2016-07-31 LAB — BAYER DCA HB A1C WAIVED: HB A1C: 5.3 % (ref <7.0)

## 2016-07-31 NOTE — Telephone Encounter (Signed)
Called patient, no answer, left message for patient to return my call.  

## 2016-07-31 NOTE — Telephone Encounter (Signed)
Please let her know that her labs all came back nice and normal except her vitamin D is a little low. I'd like her to start 1000IU D3 OTC, and that might help. However, it's likely not the main cause of her sleepiness, so I'd like to get a sleep study on her to make sure she's getting appropriately restful sleep. If she wants to talk to me, let me know, otherwise I'll fill out the paperwork as soon as she says it's OK. Thanks!

## 2016-08-01 ENCOUNTER — Telehealth: Payer: Self-pay | Admitting: Family Medicine

## 2016-08-01 NOTE — Telephone Encounter (Signed)
Pt was returning a call from Dr. Laural BenesJohnson about lab results.

## 2016-08-02 NOTE — Telephone Encounter (Signed)
Patient notified

## 2016-08-02 NOTE — Telephone Encounter (Signed)
Referral for sleep study placed ?

## 2016-08-02 NOTE — Telephone Encounter (Signed)
Patient notified about her lab results.  She is willing to do the sleep study. Will you please place the order?

## 2016-09-09 ENCOUNTER — Telehealth: Payer: BC Managed Care – PPO | Admitting: Family

## 2016-09-09 ENCOUNTER — Encounter: Payer: Self-pay | Admitting: Family Medicine

## 2016-09-09 DIAGNOSIS — B9689 Other specified bacterial agents as the cause of diseases classified elsewhere: Secondary | ICD-10-CM

## 2016-09-09 DIAGNOSIS — J019 Acute sinusitis, unspecified: Secondary | ICD-10-CM

## 2016-09-09 MED ORDER — PREDNISONE 10 MG (21) PO TBPK: 10.0000 mg | ORAL_TABLET | Freq: Every day | ORAL | 0 refills | Status: DC

## 2016-09-09 NOTE — Telephone Encounter (Signed)
Routing to provider  

## 2016-09-09 NOTE — Progress Notes (Signed)
We are sorry that you are not feeling well.  Here is how we plan to help!  Based on what you have shared with me it looks like you have upper respiratory tract inflammation that has resulted in a significant cough.  Inflammation and infection in the upper respiratory tract is commonly called bronchitis and has four common causes:  Allergies, Viral Infections, Acid Reflux and Bacterial Infections.  Allergies, viruses and acid reflux are treated by controlling symptoms or eliminating the cause. An example might be a cough caused by taking certain blood pressure medications. You stop the cough by changing the medication. Another example might be a cough caused by acid reflux. Controlling the reflux helps control the cough.  Based on your presentation I believe you most likely have A cough due to a virus.  This is called viral bronchitis and is best treated by rest, plenty of fluids and control of the cough.  You may use Ibuprofen or Tylenol as directed to help your symptoms.      Sterapred 10 mg dosepak  USE OF BRONCHODILATOR ("RESCUE") INHALERS: There is a risk from using your bronchodilator too frequently.  The risk is that over-reliance on a medication which only relaxes the muscles surrounding the breathing tubes can reduce the effectiveness of medications prescribed to reduce swelling and congestion of the tubes themselves.  Although you feel brief relief from the bronchodilator inhaler, your asthma may actually be worsening with the tubes becoming more swollen and filled with mucus.  This can delay other crucial treatments, such as oral steroid medications. If you need to use a bronchodilator inhaler daily, several times per day, you should discuss this with your provider.  There are probably better treatments that could be used to keep your asthma under control.     HOME CARE . Only take medications as instructed by your medical team. . Complete the entire course of an antibiotic. . Drink plenty  of fluids and get plenty of rest. . Avoid close contacts especially the very young and the elderly . Cover your mouth if you cough or cough into your sleeve. . Always remember to wash your hands . A steam or ultrasonic humidifier can help congestion.   GET HELP RIGHT AWAY IF: . You develop worsening fever. . You become short of breath . You cough up blood. . Your symptoms persist after you have completed your treatment plan MAKE SURE YOU   Understand these instructions.  Will watch your condition.  Will get help right away if you are not doing well or get worse.  Your e-visit answers were reviewed by a board certified advanced clinical practitioner to complete your personal care plan.  Depending on the condition, your plan could have included both over the counter or prescription medications. If there is a problem please reply  once you have received a response from your provider. Your safety is important to us.  If you have drug allergies check your prescription carefully.    You can use MyChart to ask questions about today's visit, request a non-urgent call back, or ask for a work or school excuse for 24 hours related to this e-Visit. If it has been greater than 24 hours you will need to follow up with your provider, or enter a new e-Visit to address those concerns. You will get an e-mail in the next two days asking about your experience.  I hope that your e-visit has been valuable and will speed your recovery. Thank you for  using e-visits.

## 2016-09-11 ENCOUNTER — Encounter: Payer: Self-pay | Admitting: Family Medicine

## 2016-09-17 ENCOUNTER — Ambulatory Visit: Payer: Self-pay

## 2016-10-02 ENCOUNTER — Ambulatory Visit
Admission: RE | Admit: 2016-10-02 | Discharge: 2016-10-02 | Disposition: A | Discharge status: Home / Self Care | Payer: BC Managed Care – PPO | Source: Ambulatory Visit | Attending: Family Medicine | Admitting: Family Medicine

## 2016-10-02 DIAGNOSIS — Z1239 Encounter for other screening for malignant neoplasm of breast: Secondary | ICD-10-CM

## 2016-10-02 DIAGNOSIS — Z1231 Encounter for screening mammogram for malignant neoplasm of breast: Secondary | ICD-10-CM | POA: Insufficient documentation

## 2017-06-20 ENCOUNTER — Encounter: Payer: Self-pay | Admitting: Family Medicine

## 2017-06-20 ENCOUNTER — Ambulatory Visit (INDEPENDENT_AMBULATORY_CARE_PROVIDER_SITE_OTHER): Payer: BC Managed Care – PPO | Admitting: Family Medicine

## 2017-06-20 VITALS — BP 125/82 | HR 77 | Temp 98.1°F | Wt 236.0 lb

## 2017-06-20 DIAGNOSIS — N760 Acute vaginitis: Secondary | ICD-10-CM | POA: Diagnosis not present

## 2017-06-20 DIAGNOSIS — N898 Other specified noninflammatory disorders of vagina: Secondary | ICD-10-CM | POA: Diagnosis not present

## 2017-06-20 DIAGNOSIS — B9689 Other specified bacterial agents as the cause of diseases classified elsewhere: Secondary | ICD-10-CM

## 2017-06-20 LAB — WET PREP FOR TRICH, YEAST, CLUE
Clue Cell Exam: POSITIVE — AB
TRICHOMONAS EXAM: NEGATIVE
YEAST EXAM: NEGATIVE

## 2017-06-20 MED ORDER — METRONIDAZOLE 500 MG PO TABS: 500.0000 mg | ORAL_TABLET | Freq: Two times a day (BID) | ORAL | 0 refills | Status: DC

## 2017-06-20 NOTE — Patient Instructions (Addendum)
Bacterial Vaginosis Bacterial vaginosis is a vaginal infection that occurs when the normal balance of bacteria in the vagina is disrupted. It results from an overgrowth of certain bacteria. This is the most common vaginal infection among women ages 15-44. Because bacterial vaginosis increases your risk for STIs (sexually transmitted infections), getting treated can help reduce your risk for chlamydia, gonorrhea, herpes, and HIV (human immunodeficiency virus). Treatment is also important for preventing complications in pregnant women, because this condition can cause an early (premature) delivery. What are the causes? This condition is caused by an increase in harmful bacteria that are normally present in small amounts in the vagina. However, the reason that the condition develops is not fully understood. What increases the risk? The following factors may make you more likely to develop this condition:  Having a new sexual partner or multiple sexual partners.  Having unprotected sex.  Douching.  Having an intrauterine device (IUD).  Smoking.  Drug and alcohol abuse.  Taking certain antibiotic medicines.  Being pregnant.  You cannot get bacterial vaginosis from toilet seats, bedding, swimming pools, or contact with objects around you. What are the signs or symptoms? Symptoms of this condition include:  Grey or white vaginal discharge. The discharge can also be watery or foamy.  A fish-like odor with discharge, especially after sexual intercourse or during menstruation.  Itching in and around the vagina.  Burning or pain with urination.  Some women with bacterial vaginosis have no signs or symptoms. How is this diagnosed? This condition is diagnosed based on:  Your medical history.  A physical exam of the vagina.  Testing a sample of vaginal fluid under a microscope to look for a large amount of bad bacteria or abnormal cells. Your health care provider may use a cotton swab  or a small wooden spatula to collect the sample.  How is this treated? This condition is treated with antibiotics. These may be given as a pill, a vaginal cream, or a medicine that is put into the vagina (suppository). If the condition comes back after treatment, a second round of antibiotics may be needed. Follow these instructions at home: Medicines  Take over-the-counter and prescription medicines only as told by your health care provider.  Take or use your antibiotic as told by your health care provider. Do not stop taking or using the antibiotic even if you start to feel better. General instructions  If you have a female sexual partner, tell her that you have a vaginal infection. She should see her health care provider and be treated if she has symptoms. If you have a female sexual partner, he does not need treatment.  During treatment: ? Avoid sexual activity until you finish treatment. ? Do not douche. ? Avoid alcohol as directed by your health care provider. ? Avoid breastfeeding as directed by your health care provider.  Drink enough water and fluids to keep your urine clear or pale yellow.  Keep the area around your vagina and rectum clean. ? Wash the area daily with warm water. ? Wipe yourself from front to back after using the toilet.  Keep all follow-up visits as told by your health care provider. This is important. How is this prevented?  Do not douche.  Wash the outside of your vagina with warm water only.  Use protection when having sex. This includes latex condoms and dental dams.  Limit how many sexual partners you have. To help prevent bacterial vaginosis, it is best to have sex with just   one partner (monogamous).  Make sure you and your sexual partner are tested for STIs.  Wear cotton or cotton-lined underwear.  Avoid wearing tight pants and pantyhose, especially during summer.  Limit the amount of alcohol that you drink.  Do not use any products that  contain nicotine or tobacco, such as cigarettes and e-cigarettes. If you need help quitting, ask your health care provider.  Do not use illegal drugs. Where to find more information:  Centers for Disease Control and Prevention: www.cdc.gov/std  American Sexual Health Association (ASHA): www.ashastd.org  [place] Department of Health and Human Services, Office on Women's Health: www.womenshealth.gov/ or https://www.womenshealth.gov/a-z-topics/bacterial-vaginosis Contact a health care provider if:  Your symptoms do not improve, even after treatment.  You have more discharge or pain when urinating.  You have a fever.  You have pain in your abdomen.  You have pain during sex.  You have vaginal bleeding between periods. Summary  Bacterial vaginosis is a vaginal infection that occurs when the normal balance of bacteria in the vagina is disrupted.  Because bacterial vaginosis increases your risk for STIs (sexually transmitted infections), getting treated can help reduce your risk for chlamydia, gonorrhea, herpes, and HIV (human immunodeficiency virus). Treatment is also important for preventing complications in pregnant women, because the condition can cause an early (premature) delivery.  This condition is treated with antibiotic medicines. These may be given as a pill, a vaginal cream, or a medicine that is put into the vagina (suppository). This information is not intended to replace advice given to you by your health care provider. Make sure you discuss any questions you have with your health care provider. Document Released: 09/16/2005 Document Revised: 06/01/2016 Document Reviewed: 06/01/2016 Elsevier Interactive Patient Education  2017 [employer]  

## 2017-06-20 NOTE — Progress Notes (Signed)
BP 125/82 (BP Location: Left Arm, Patient Position: Sitting)   Pulse 77   Temp 98.1 F (36.7 C)   Wt 236 lb (107 kg)   BMI 45.33 kg/m    Subjective:    Patient ID: Tanya Haas, female    DOB: 1971-01-24, 46 y.o.   MRN: [id number]  HPI: Tanya Haas is a 46 y.o. female  Chief Complaint  Patient presents with  . Other    vaginal irritation   VAGINAL DISCHARGE Duration: about a week Discharge description: clear  Pruritus: yes Dysuria: no Malodorous: no Urinary frequency: no Fevers: no Abdominal pain: no  Sexual activity: monogamous History of sexually transmitted diseases: no Recent antibiotic use: no Context: stable  Treatments attempted: none  Relevant past medical, surgical, family and social history reviewed and updated as indicated. Interim medical history since our last visit reviewed. Allergies and medications reviewed and updated.  Review of Systems  Constitutional: Negative.   Respiratory: Negative.   Cardiovascular: Negative.   Genitourinary: Positive for vaginal discharge. Negative for decreased urine volume, difficulty urinating, dyspareunia, dysuria, enuresis, flank pain, frequency, genital sores, hematuria, menstrual problem, pelvic pain, urgency, vaginal bleeding and vaginal pain.  Psychiatric/Behavioral: Negative.     Per HPI unless specifically indicated above     Objective:    BP 125/82 (BP Location: Left Arm, Patient Position: Sitting)   Pulse 77   Temp 98.1 F (36.7 C)   Wt 236 lb (107 kg)   BMI 45.33 kg/m   Wt Readings from Last 3 Encounters:  06/20/17 236 lb (107 kg)  07/29/16 240 lb 8 oz (109.1 kg)  07/18/16 240 lb (108.9 kg)    Physical Exam  Constitutional: She is oriented to person, place, and time. She appears well-developed and well-nourished. No distress.  HENT:  Head: Normocephalic and atraumatic.  Right Ear: Hearing normal.  Left Ear: Hearing normal.  Nose: Nose normal.  Eyes: Conjunctivae and lids are  normal. Right eye exhibits no discharge. Left eye exhibits no discharge. No scleral icterus.  Pulmonary/Chest: Effort normal. No respiratory distress.  Abdominal: Hernia confirmed negative in the right inguinal area and confirmed negative in the left inguinal area.  Genitourinary: No labial fusion. There is no rash, tenderness, lesion or injury on the right labia. There is no rash, tenderness, lesion or injury on the left labia. There is erythema in the vagina. No tenderness or bleeding in the vagina. No foreign body in the vagina. No signs of injury around the vagina. Vaginal discharge found.  Genitourinary Comments: Irritated labia and introitus  Musculoskeletal: Normal range of motion.  Neurological: She is alert and oriented to person, place, and time.  Skin: Skin is intact. No rash noted.  Psychiatric: She has a normal mood and affect. Her speech is normal and behavior is normal. Judgment and thought content normal. Cognition and memory are normal.  Nursing note and vitals reviewed.   Results for orders placed or performed in visit on 07/29/16  Microscopic Examination  Result Value Ref Range   WBC, UA 6-10 (A) 0 - 5 /hpf   RBC, UA 11-30 (A) 0 - 2 /hpf   Epithelial Cells (non renal) >10 (A) 0 - 10 /hpf   Mucus, UA Present (A) Not Estab.   Bacteria, UA Moderate (A) None seen/Few  CBC with Differential/Platelet  Result Value Ref Range   WBC 7.4 3.4 - 10.8 x10E3/uL   RBC 4.07 3.77 - 5.28 x10E6/uL   Hemoglobin 12.0 11.1 - 15.9 g/dL  Hematocrit 36.0 34.0 - 46.6 %   MCV 89 79 - 97 fL   MCH 29.5 26.6 - 33.0 pg   MCHC 33.3 31.5 - 35.7 g/dL   RDW 09.8 11.9 - 14.7 %   Platelets 387 (H) 150 - 379 x10E3/uL   Neutrophils 65 Not Estab. %   Lymphs 28 Not Estab. %   Monocytes 5 Not Estab. %   Eos 2 Not Estab. %   Basos 0 Not Estab. %   Neutrophils Absolute 4.8 1.4 - 7.0 x10E3/uL   Lymphocytes Absolute 2.1 0.7 - 3.1 x10E3/uL   Monocytes Absolute 0.3 0.1 - 0.9 x10E3/uL   EOS (ABSOLUTE) 0.1  0.0 - 0.4 x10E3/uL   Basophils Absolute 0.0 0.0 - 0.2 x10E3/uL   Immature Granulocytes 0 Not Estab. %   Immature Grans (Abs) 0.0 0.0 - 0.1 x10E3/uL  Bayer DCA Hb A1c Waived  Result Value Ref Range   Bayer DCA Hb A1c Waived 5.3 <7.0 %  Comprehensive metabolic panel  Result Value Ref Range   Glucose 88 65 - 99 mg/dL   BUN 14 6 - 24 mg/dL   Creatinine, Ser 8.29 0.57 - 1.00 mg/dL   GFR calc non Af Amer 82 >59 mL/min/1.73   GFR calc Af Amer 94 >59 mL/min/1.73   BUN/Creatinine Ratio 16 9 - 23   Sodium 140 134 - 144 mmol/L   Potassium 4.8 3.5 - 5.2 mmol/L   Chloride 99 96 - 106 mmol/L   CO2 27 18 - 29 mmol/L   Calcium 9.4 8.7 - 10.2 mg/dL   Total Protein 6.7 6.0 - 8.5 g/dL   Albumin 4.3 3.5 - 5.5 g/dL   Globulin, Total 2.4 1.5 - 4.5 g/dL   Albumin/Globulin Ratio 1.8 1.2 - 2.2   Bilirubin Total 0.7 0.0 - 1.2 mg/dL   Alkaline Phosphatase 50 39 - 117 IU/L   AST 12 0 - 40 IU/L   ALT 11 0 - 32 IU/L  Lipid Panel w/o Chol/HDL Ratio  Result Value Ref Range   Cholesterol, Total 193 100 - 199 mg/dL   Triglycerides 562 (H) 0 - 149 mg/dL   HDL 48 >13 mg/dL   VLDL Cholesterol Cal 53 (H) 5 - 40 mg/dL   LDL Calculated 92 0 - 99 mg/dL  UA/M w/rflx Culture, Routine  Result Value Ref Range   Specific Gravity, UA 1.025 1.005 - 1.030   pH, UA 6.5 5.0 - 7.5   Color, UA Yellow Yellow   Appearance Ur Cloudy (A) Clear   Leukocytes, UA 1+ (A) Negative   Protein, UA Negative Negative/Trace   Glucose, UA Negative Negative   Ketones, UA Negative Negative   RBC, UA 3+ (A) Negative   Bilirubin, UA Negative Negative   Urobilinogen, Ur 1.0 0.2 - 1.0 mg/dL   Nitrite, UA Negative Negative   Microscopic Examination See below:    Urinalysis Reflex Comment   Thyroid Panel With TSH  Result Value Ref Range   TSH 2.430 0.450 - 4.500 uIU/mL   T4, Total 7.9 4.5 - 12.0 ug/dL   T3 Uptake Ratio 25 24 - 39 %   Free Thyroxine Index 2.0 1.2 - 4.9  Lyme Ab/Western Blot Reflex  Result Value Ref Range   Lyme  IgG/IgM Ab <0.91 0.00 - 0.90 ISR   LYME DISEASE AB, QUANT, IGM <0.80 0.00 - 0.79 index  VITAMIN D 25 Hydroxy (Vit-D Deficiency, Fractures)  Result Value Ref Range   Vit D, 25-Hydroxy 19.7 (L) 30.0 - 100.0 ng/mL  Rocky mtn spotted fvr abs pnl(IgG+IgM)  Result Value Ref Range   RMSF IgG Negative Negative   RMSF IgM 0.43 0.00 - 0.89 index  Babesia microti Antibody Panel  Result Value Ref Range   Babesia microti IgM <1:10 Neg:<1:10   Babesia microti IgG <1:10 Neg:<1:10  Ehrlichia Antibody Panel  Result Value Ref Range   E.Chaffeensis (HME) IgG Negative Neg:<1:64   E. Chaffeensis (HME) IgM Titer Negative Neg:<1:20   HGE IgG Titer Negative Neg:<1:64   HGE IgM Titer Negative Neg:<1:20  Urine Culture, Routine  Result Value Ref Range   Urine Culture, Routine Final report    Organism ID, Bacteria Comment       Assessment & Plan:   Problem List Items Addressed This Visit    None    Visit Diagnoses    Vaginal irritation    -  Primary   Checking wet prep. + clue cells   Relevant Orders   WET PREP FOR TRICH, YEAST, CLUE   BV (bacterial vaginosis)       Will treat with metronidazole. Call with any concerns. Let us know if not getting better or getting worse.    Relevant Medications   metroNIDAZOLE (FLAGYL) 500 MG tablet       Follow up plan: Return November, for Physical.

## 2017-08-01 ENCOUNTER — Ambulatory Visit: Payer: Self-pay | Admitting: Family Medicine

## 2017-08-01 IMAGING — MG MM DIGITAL SCREENING BILAT W/ CAD
4 series · 4 of 4 positions shown · non-contrast
Comparison: Previous exam(s).

CLINICAL DATA: Screening.

EXAM:
DIGITAL SCREENING BILATERAL MAMMOGRAM WITH CAD

[R MLO]
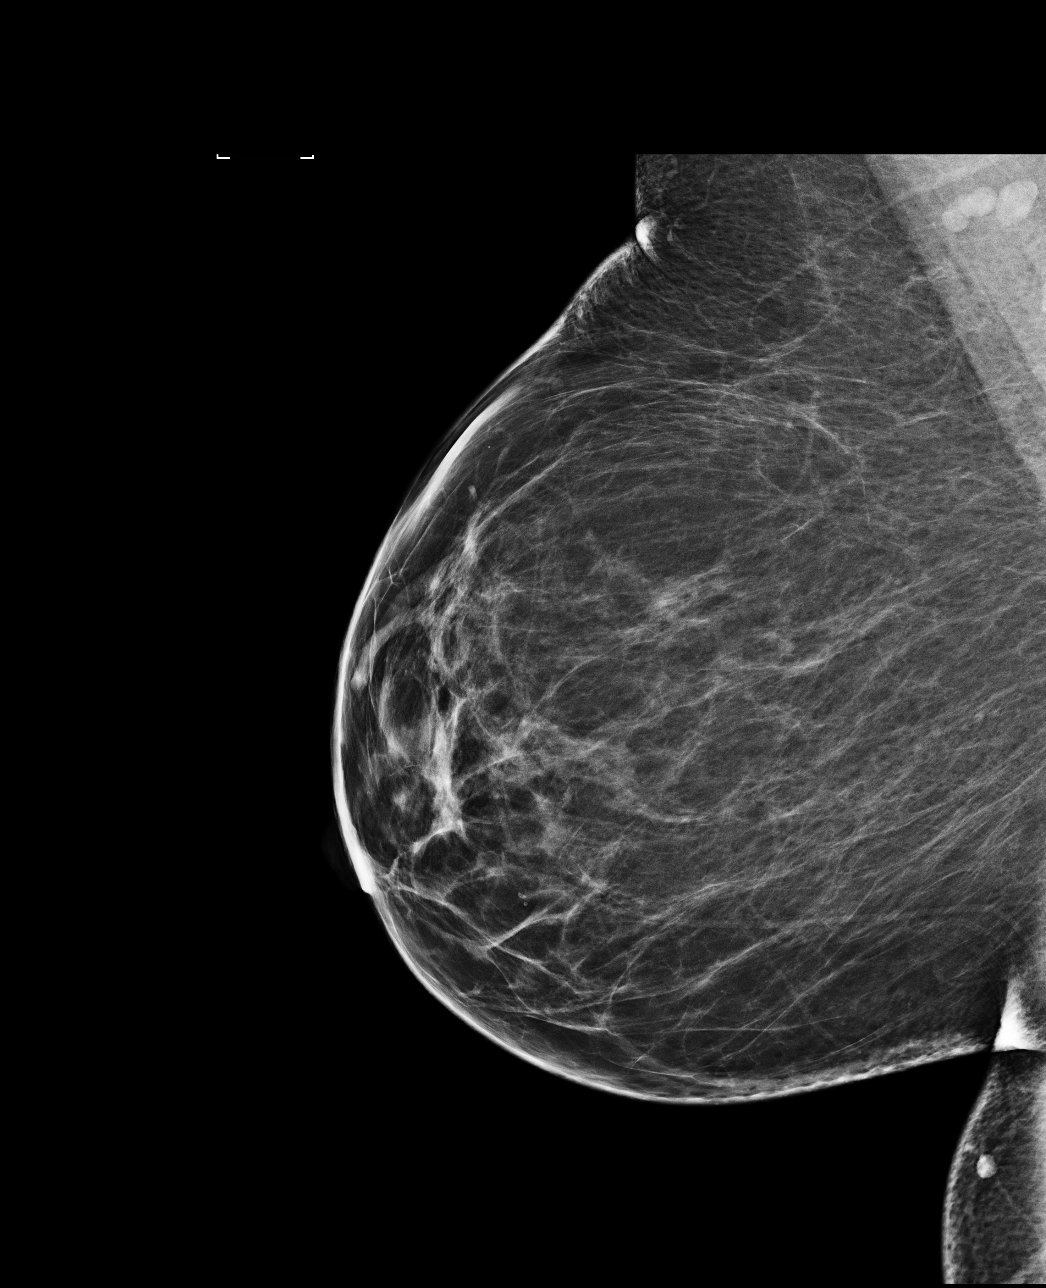

[L CC]
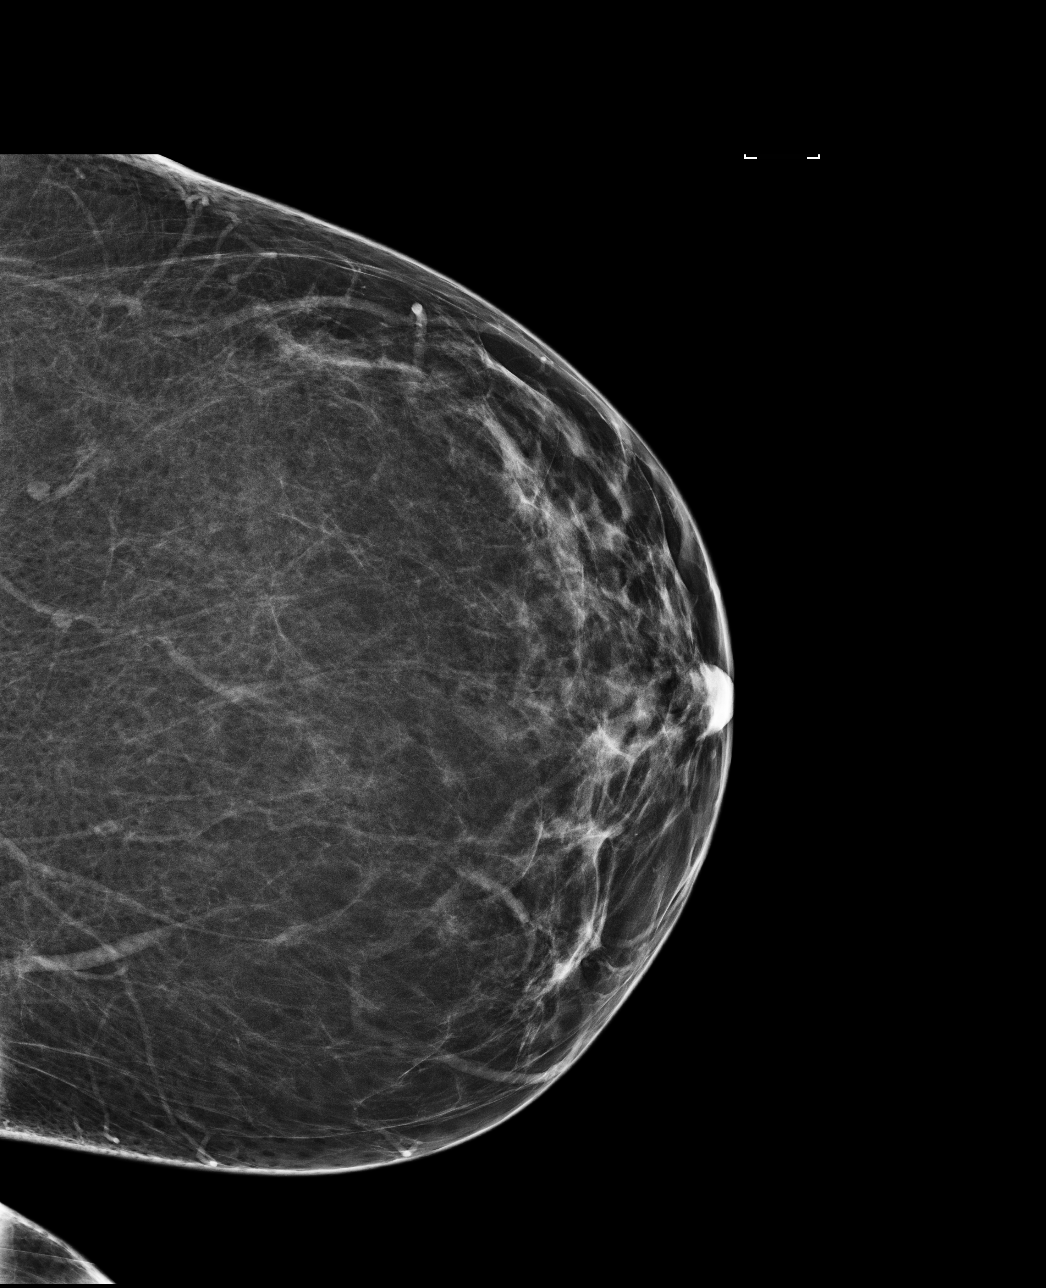

[R CC]
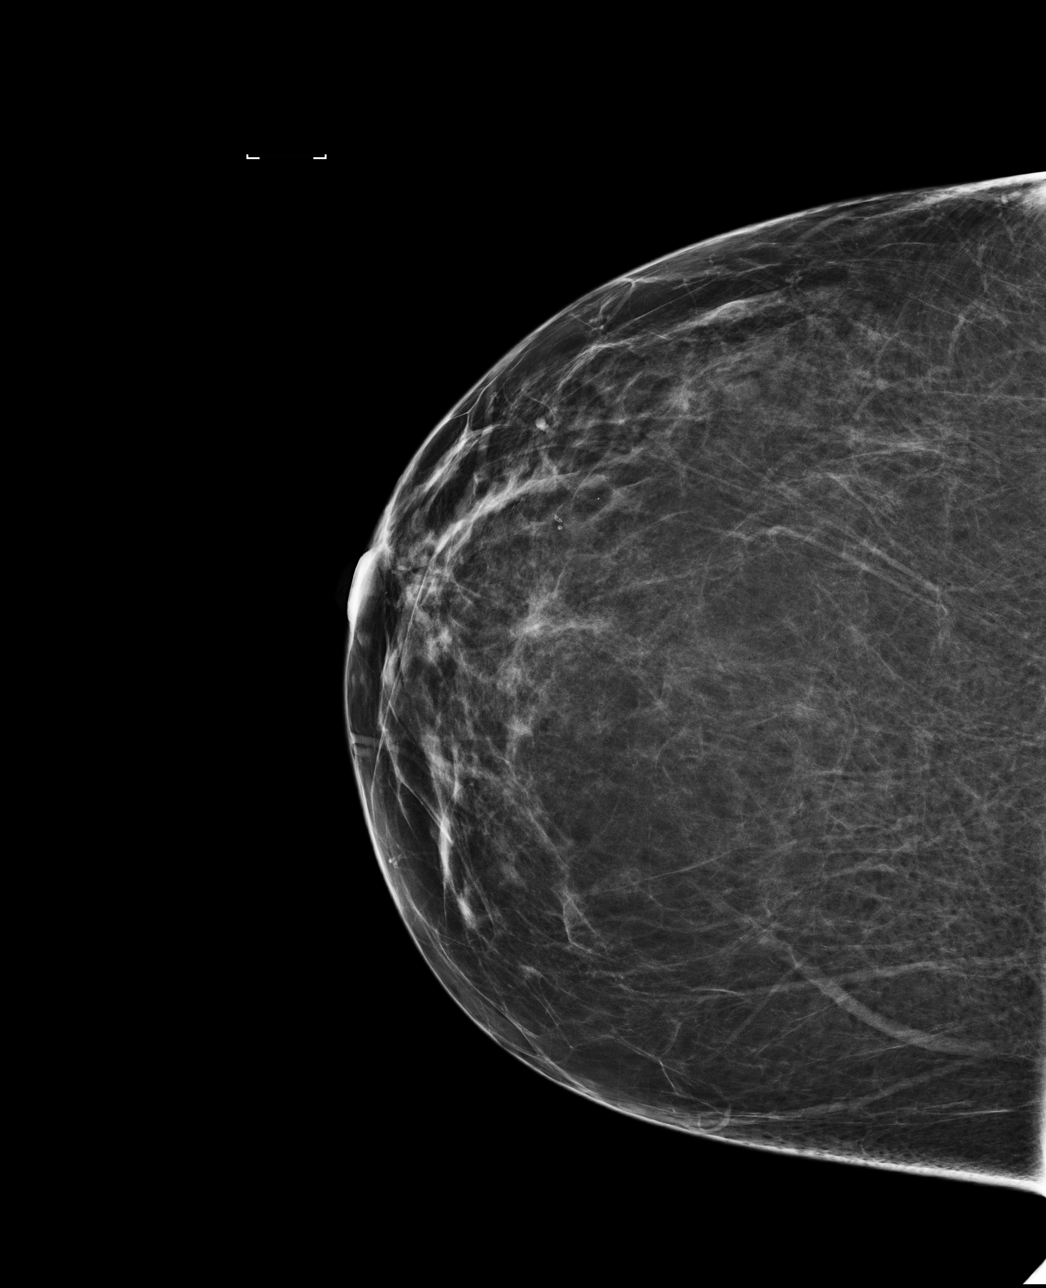

[L MLO]
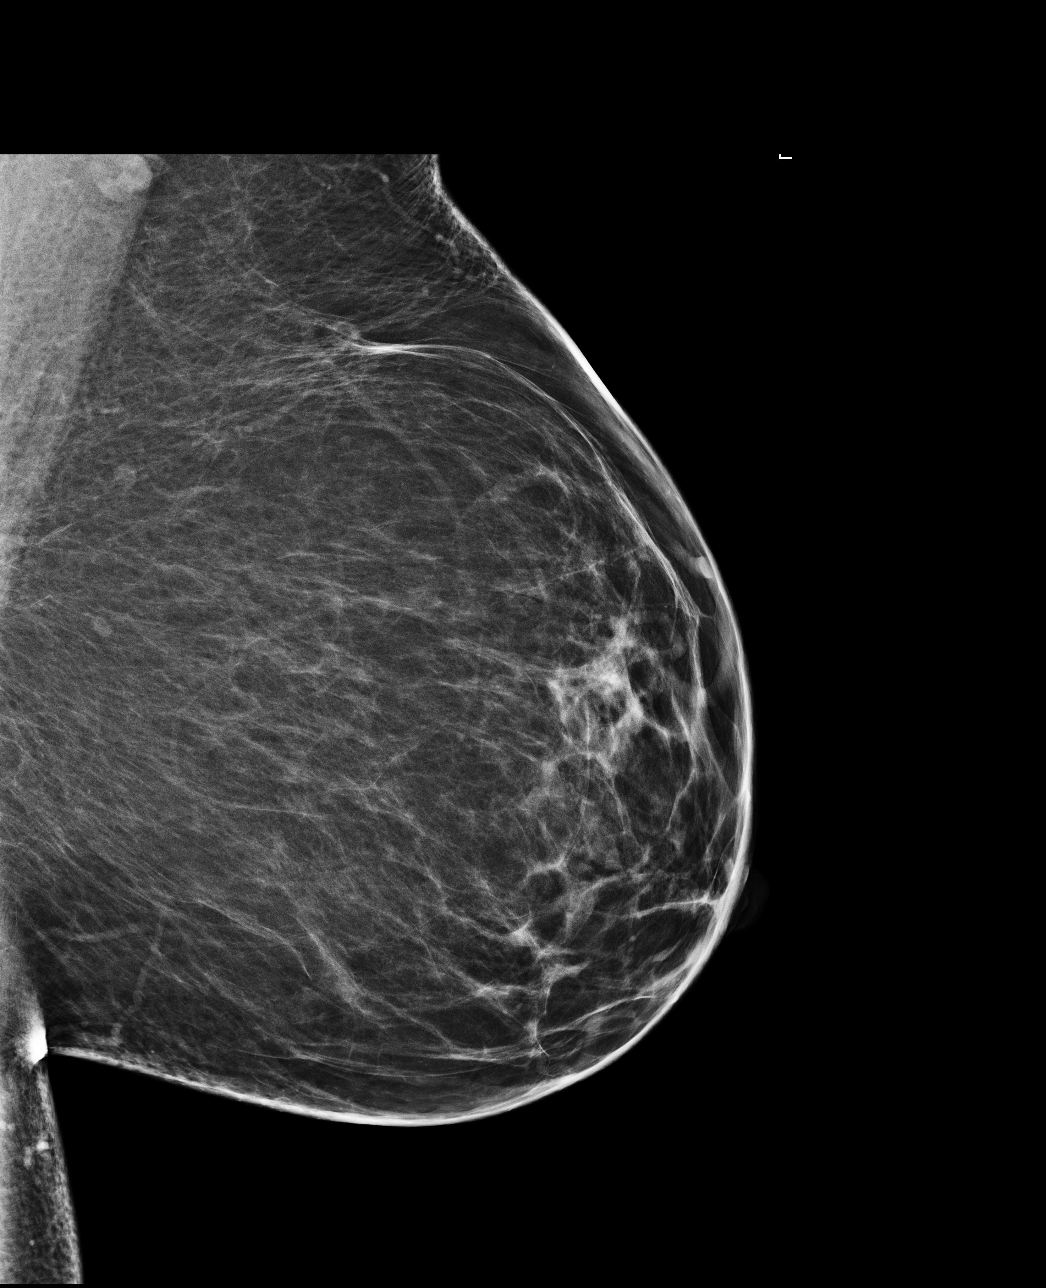

[4 of 4 positions shown; findings below may reference images not displayed]

ACR Breast Density Category b: There are scattered areas of
fibroglandular density.
FINDINGS: There are no findings suspicious for malignancy. Images were
processed with CAD.
IMPRESSION: No mammographic evidence of malignancy. A result letter of this
screening mammogram will be mailed directly to the patient.

RECOMMENDATION:
Screening mammogram in one year. (Code:AS-G-LCT)

BI-RADS CATEGORY  1: Negative.

## 2017-08-07 ENCOUNTER — Ambulatory Visit (INDEPENDENT_AMBULATORY_CARE_PROVIDER_SITE_OTHER): Payer: BC Managed Care – PPO | Admitting: Family Medicine

## 2017-08-07 ENCOUNTER — Encounter: Payer: Self-pay | Admitting: Family Medicine

## 2017-08-07 VITALS — BP 124/84 | HR 72 | Temp 98.6°F | Ht 61.2 in | Wt 236.3 lb

## 2017-08-07 DIAGNOSIS — Z1231 Encounter for screening mammogram for malignant neoplasm of breast: Secondary | ICD-10-CM | POA: Diagnosis not present

## 2017-08-07 DIAGNOSIS — Z124 Encounter for screening for malignant neoplasm of cervix: Secondary | ICD-10-CM

## 2017-08-07 DIAGNOSIS — Z0000 Encounter for general adult medical examination without abnormal findings: Secondary | ICD-10-CM | POA: Diagnosis not present

## 2017-08-07 DIAGNOSIS — Z1239 Encounter for other screening for malignant neoplasm of breast: Secondary | ICD-10-CM

## 2017-08-07 LAB — UA/M W/RFLX CULTURE, ROUTINE
Bilirubin, UA: NEGATIVE
GLUCOSE, UA: NEGATIVE
KETONES UA: NEGATIVE
LEUKOCYTES UA: NEGATIVE
Nitrite, UA: NEGATIVE
Protein, UA: NEGATIVE
RBC, UA: NEGATIVE
Specific Gravity, UA: 1.025 (ref 1.005–1.030)
Urobilinogen, Ur: 0.2 mg/dL (ref 0.2–1.0)
pH, UA: 5.5 (ref 5.0–7.5)

## 2017-08-07 NOTE — Progress Notes (Signed)
BP 124/84 (BP Location: Left Arm, Patient Position: Sitting, Cuff Size: Large)   Pulse 72   Temp 98.6 F (37 C)   Ht 5' 1.2" (1.554 m)   Wt 236 lb 5 oz (107.2 kg)   SpO2 99%   BMI 44.36 kg/m    Subjective:    Patient ID: Tanya Haas, Haas    DOB: 09/30/1971, 46 y.o.   MRN: [id number]  HPI: Tanya Haas is a 46 y.o. Haas presenting on 08/07/2017 for comprehensive medical examination. Current medical complaints include:none  She currently lives with: husband and kids Menopausal Symptoms: no  Depression Screen done today and results listed below:  [facility]07/2017 07/29/2016  Decreased Interest 0 1  Down, Depressed, Hopeless 0 0  PHQ - 2 Score 0 1  Altered sleeping 0 -  Tired, decreased energy 1 -  Change in appetite 1 -  Feeling bad or failure about yourself  0 -  Trouble concentrating 0 -  Moving slowly or fidgety/restless 0 -  Suicidal thoughts 0 -  PHQ-9 Score 2 -    Past Medical History:  Past Medical History:  Diagnosis Date  . History of kidney stones   . Obesity   . Stomach ulcer     Surgical History:  Past Surgical History:  Procedure Laterality Date  . KIDNEY STONE SURGERY      Medications:  Current Outpatient Medications on File Prior to Visit  Medication Sig  . acyclovir (ZOVIRAX) 800 MG tablet TK 1 T PO Q 4 H FOR 10 DAYS. MDD 5 TS   No current facility-administered medications on file prior to visit.     Allergies:  No Known Allergies  Social History:  Social History   Socioeconomic History  . Marital status: Married    Spouse name: Not on file  . Number of children: Not on file  . Years of education: Not on file  . Highest education level: Not on file  Social Needs  . Financial resource strain: Not on file  . Food insecurity - worry: Not on file  . Food insecurity - inability: Not on file  . Transportation needs - medical: Not on file  . Transportation needs - non-medical: Not on file    Occupational History  . Not on file  Tobacco Use  . Smoking status: Never Smoker  . Smokeless tobacco: Never Used  Substance and Sexual Activity  . Alcohol use: No  . Drug use: No  . Sexual activity: Yes    Birth control/protection: None, Other-see comments    Comment: Vasectomy for husband  Other Topics Concern  . Not on file  Social History Narrative  . Not on file   Social History   Tobacco Use  Smoking Status Never Smoker  Smokeless Tobacco Never Used   Social History   Substance and Sexual Activity  Alcohol Use No    Family History:  Family History  Problem Relation Age of Onset  . Cancer Mother        Uterine  . Heart disease Father   . Diabetes Maternal Grandmother   . Heart disease Maternal Grandmother   . Heart disease Paternal Grandmother   . AAA (abdominal aortic aneurysm) Paternal Grandfather     Past medical history, surgical history, medications, allergies, family history and social history reviewed with patient today and changes made to appropriate areas of the chart.   Review of Systems  Constitutional: Negative.   HENT: Negative.  Eyes: Negative.   Respiratory: Negative.   Cardiovascular: Positive for leg swelling. Negative for chest pain, palpitations, orthopnea, claudication and PND.  Gastrointestinal: Negative.   Genitourinary: Negative.   Musculoskeletal: Negative.   Skin: Positive for rash (Had shingles last week). Negative for itching.  Neurological: Negative.   Endo/Heme/Allergies: Negative.   Psychiatric/Behavioral: Negative.     All other ROS negative except what is listed above and in the HPI.      Objective:    BP 124/84 (BP Location: Left Arm, Patient Position: Sitting, Cuff Size: Large)   Pulse 72   Temp 98.6 F (37 C)   Ht 5' 1.2" (1.554 m)   Wt 236 lb 5 oz (107.2 kg)   SpO2 99%   BMI 44.36 kg/m   Wt Readings from Last 3 Encounters:  08/07/17 236 lb 5 oz (107.2 kg)  06/20/17 236 lb (107 kg)  07/29/16 240 lb 8  oz (109.1 kg)    Physical Exam  Constitutional: She is oriented to person, place, and time. She appears well-developed and well-nourished. No distress.  HENT:  Head: Normocephalic and atraumatic.  Right Ear: Hearing, tympanic membrane, external ear and ear canal normal.  Left Ear: Hearing, tympanic membrane, external ear and ear canal normal.  Nose: Nose normal.  Mouth/Throat: Uvula is midline, oropharynx is clear and moist and mucous membranes are normal. No oropharyngeal exudate.  Eyes: Conjunctivae, EOM and lids are normal. Pupils are equal, round, and reactive to light. Right eye exhibits no discharge. Left eye exhibits no discharge. No scleral icterus.  Neck: Normal range of motion. Neck supple. No JVD present. No tracheal deviation present. No thyromegaly present.  Cardiovascular: Normal rate, regular rhythm, normal heart sounds and intact distal pulses. Exam reveals no gallop and no friction rub.  No murmur heard. Pulmonary/Chest: Effort normal and breath sounds normal. No stridor. No respiratory distress. She has no wheezes. She has no rales. She exhibits no tenderness. Right breast exhibits no inverted nipple, no mass, no nipple discharge, no skin change and no tenderness. Left breast exhibits no inverted nipple, no mass, no nipple discharge, no skin change and no tenderness. Breasts are symmetrical.  Abdominal: Soft. Bowel sounds are normal. She exhibits no distension and no mass. There is no tenderness. There is no rebound and no guarding. Hernia confirmed negative in the right inguinal area and confirmed negative in the left inguinal area.  Genitourinary: Vagina normal and uterus normal. No labial fusion. There is no rash, tenderness, lesion or injury on the right labia. There is no rash, tenderness, lesion or injury on the left labia. Uterus is not deviated, not enlarged, not fixed and not tender. Cervix exhibits no motion tenderness, no discharge and no friability. Right adnexum  displays no mass, no tenderness and no fullness. Left adnexum displays no mass, no tenderness and no fullness. No erythema, tenderness or bleeding in the vagina. No foreign body in the vagina. No signs of injury around the vagina. No vaginal discharge found.  Musculoskeletal: Normal range of motion. She exhibits no edema, tenderness or deformity.  Lymphadenopathy:    She has no cervical adenopathy.  Neurological: She is alert and oriented to person, place, and time. She has normal reflexes. She displays normal reflexes. No cranial nerve deficit. She exhibits normal muscle tone. Coordination normal.  Skin: Skin is warm, dry and intact. No rash noted. She is not diaphoretic. No erythema. No pallor.  Psychiatric: She has a normal mood and affect. Her speech is normal and behavior  is normal. Judgment and thought content normal. Cognition and memory are normal.  Nursing note and vitals reviewed.   Results for orders placed or performed in visit on 06/20/17  WET PREP FOR TRICH, YEAST, CLUE  Result Value Ref Range   Trichomonas Exam Negative Negative   Yeast Exam Negative Negative   Clue Cell Exam Positive (A) Negative      Assessment & Plan:   Problem List Items Addressed This Visit    None    Visit Diagnoses    Routine general medical examination at a health care facility    -  Primary   Vaccines up to date. Screening labs checked today. Pap done. Mammogram ordered. Continue diet and exercise. Call with any concerns.    Relevant Orders   CBC with Differential/Platelet   Comprehensive metabolic panel   Lipid Panel w/o Chol/HDL Ratio   TSH   UA/M w/rflx Culture, Routine   Screening for cervical cancer       Pap done today.   Relevant Orders   IGP, Aptima HPV, rfx 16/18,45   Screening for breast cancer       Mammogram ordered today.   Relevant Orders   MM DIGITAL SCREENING BILATERAL       Follow up plan: Return in about 1 year (around 08/07/2018) for Physical.   LABORATORY  TESTING:  - Pap smear: pap done  IMMUNIZATIONS:   - Tdap: Tetanus vaccination status reviewed: last tetanus booster within 10 years. - Influenza: Refused - Pneumovax: Not applicable   SCREENING: -Mammogram: Ordered today    PATIENT COUNSELING:   Advised to take 1 mg of folate supplement per day if capable of pregnancy.   Sexuality: Discussed sexually transmitted diseases, partner selection, use of condoms, avoidance of unintended pregnancy  and contraceptive alternatives.   Advised to avoid cigarette smoking.  I discussed with the patient that most people either abstain from alcohol or drink within safe limits (<=14/week and <=4 drinks/occasion for males, <=7/weeks and <= 3 drinks/occasion for females) and that the risk for alcohol disorders and other health effects rises proportionally with the number of drinks per week and how often a drinker exceeds daily limits.  Discussed cessation/primary prevention of drug use and availability of treatment for abuse.   Diet: Encouraged to adjust caloric intake to maintain  or achieve ideal body weight, to reduce intake of dietary saturated fat and total fat, to limit sodium intake by avoiding high sodium foods and not adding table salt, and to maintain adequate dietary potassium and calcium preferably from fresh fruits, vegetables, and low-fat dairy products.    stressed the importance of regular exercise  Injury prevention: Discussed safety belts, safety helmets, smoke detector, smoking near bedding or upholstery.   Dental health: Discussed importance of regular tooth brushing, flossing, and dental visits.    NEXT PREVENTATIVE PHYSICAL DUE IN 1 YEAR. Return in about 1 year (around 08/07/2018) for Physical.

## 2017-08-07 NOTE — Patient Instructions (Addendum)

## 2017-08-08 LAB — COMPREHENSIVE METABOLIC PANEL
ALK PHOS: 46 IU/L (ref 39–117)
ALT: 11 IU/L (ref 0–32)
AST: 13 IU/L (ref 0–40)
Albumin/Globulin Ratio: 1.5 (ref 1.2–2.2)
Albumin: 4.2 g/dL (ref 3.5–5.5)
BUN/Creatinine Ratio: 27 — ABNORMAL HIGH (ref 9–23)
BUN: 20 mg/dL (ref 6–24)
Bilirubin Total: 0.6 mg/dL (ref 0.0–1.2)
CALCIUM: 9.8 mg/dL (ref 8.7–10.2)
CO2: 27 mmol/L (ref 20–29)
CREATININE: 0.73 mg/dL (ref 0.57–1.00)
Chloride: 100 mmol/L (ref 96–106)
GFR calc Af Amer: 114 mL/min/{1.73_m2} (ref >59)
GFR, EST NON AFRICAN AMERICAN: 99 mL/min/{1.73_m2} (ref >59)
GLUCOSE: 88 mg/dL (ref 65–99)
Globulin, Total: 2.8 g/dL (ref 1.5–4.5)
Potassium: 4.6 mmol/L (ref 3.5–5.2)
SODIUM: 138 mmol/L (ref 134–144)
Total Protein: 7 g/dL (ref 6.0–8.5)

## 2017-08-08 LAB — CBC WITH DIFFERENTIAL/PLATELET
BASOS ABS: 0 10*3/uL (ref 0.0–0.2)
Basos: 0 %
EOS (ABSOLUTE): 0 10*3/uL (ref 0.0–0.4)
Eos: 0 %
Hematocrit: 36.8 % (ref 34.0–46.6)
Hemoglobin: 12.3 g/dL (ref 11.1–15.9)
Immature Grans (Abs): 0 10*3/uL (ref 0.0–0.1)
Immature Granulocytes: 0 %
LYMPHS ABS: 2.8 10*3/uL (ref 0.7–3.1)
Lymphs: 23 %
MCH: 29.5 pg (ref 26.6–33.0)
MCHC: 33.4 g/dL (ref 31.5–35.7)
MCV: 88 fL (ref 79–97)
Monocytes Absolute: 0.6 10*3/uL (ref 0.1–0.9)
Monocytes: 5 %
NEUTROS ABS: 9 10*3/uL — AB (ref 1.4–7.0)
Neutrophils: 72 %
Platelets: 384 10*3/uL — ABNORMAL HIGH (ref 150–379)
RBC: 4.17 x10E6/uL (ref 3.77–5.28)
RDW: 13.7 % (ref 12.3–15.4)
WBC: 12.5 10*3/uL — AB (ref 3.4–10.8)

## 2017-08-08 LAB — TSH: TSH: 2.1 u[IU]/mL (ref 0.450–4.500)

## 2017-08-08 LAB — LIPID PANEL W/O CHOL/HDL RATIO
CHOLESTEROL TOTAL: 188 mg/dL (ref 100–199)
HDL: 53 mg/dL (ref >39)
LDL CALC: 74 mg/dL (ref 0–99)
Triglycerides: 304 mg/dL — ABNORMAL HIGH (ref 0–149)
VLDL CHOLESTEROL CAL: 61 mg/dL — AB (ref 5–40)

## 2017-08-11 LAB — IGP, APTIMA HPV, RFX 16/18,45
HPV APTIMA: NEGATIVE
PAP Smear Comment: 0

## 2017-10-06 ENCOUNTER — Ambulatory Visit
Admission: RE | Admit: 2017-10-06 | Discharge: 2017-10-06 | Disposition: A | Discharge status: Home / Self Care | Payer: BC Managed Care – PPO | Source: Ambulatory Visit | Attending: Family Medicine | Admitting: Family Medicine

## 2017-10-06 DIAGNOSIS — Z1231 Encounter for screening mammogram for malignant neoplasm of breast: Secondary | ICD-10-CM | POA: Insufficient documentation

## 2017-10-06 DIAGNOSIS — Z1239 Encounter for other screening for malignant neoplasm of breast: Secondary | ICD-10-CM

## 2017-10-14 ENCOUNTER — Encounter: Payer: Self-pay | Admitting: Family Medicine

## 2017-12-29 ENCOUNTER — Encounter: Payer: Self-pay | Admitting: Family Medicine

## 2017-12-29 DIAGNOSIS — M722 Plantar fascial fibromatosis: Secondary | ICD-10-CM

## 2018-01-22 DIAGNOSIS — N200 Calculus of kidney: Secondary | ICD-10-CM | POA: Insufficient documentation

## 2018-01-22 DIAGNOSIS — K259 Gastric ulcer, unspecified as acute or chronic, without hemorrhage or perforation: Secondary | ICD-10-CM | POA: Insufficient documentation

## 2018-01-22 DIAGNOSIS — E668 Other obesity: Secondary | ICD-10-CM | POA: Insufficient documentation

## 2018-01-23 ENCOUNTER — Ambulatory Visit (INDEPENDENT_AMBULATORY_CARE_PROVIDER_SITE_OTHER): Payer: BC Managed Care – PPO

## 2018-01-23 ENCOUNTER — Encounter: Payer: Self-pay | Admitting: Podiatry

## 2018-01-23 ENCOUNTER — Ambulatory Visit: Payer: BC Managed Care – PPO | Admitting: Podiatry

## 2018-01-23 DIAGNOSIS — M722 Plantar fascial fibromatosis: Secondary | ICD-10-CM

## 2018-01-23 MED ORDER — MELOXICAM 15 MG PO TABS: 15.0000 mg | ORAL_TABLET | Freq: Every day | ORAL | 1 refills | Status: AC

## 2018-01-23 MED ORDER — METHYLPREDNISOLONE 4 MG PO TBPK: ORAL_TABLET | ORAL | 0 refills | Status: DC

## 2018-01-26 NOTE — Progress Notes (Signed)
   Subjective: 47 year old female presenting today as a new patient with a chief complaint of pain to the plantar aspect of the right heel that began 6 months ago. She states the pain is worse when she first gets out of bed in the morning. Standing and walking for long periods of time also increase the pain. She has been stretching the foot and wearing different shoes for treatment. Patient is here for further evaluation and treatment.   Past Medical History:  Diagnosis Date  . History of kidney stones   . Obesity   . Stomach ulcer      Objective: Physical Exam General: The patient is alert and oriented x3 in no acute distress.  Dermatology: Skin is warm, dry and supple bilateral lower extremities. Negative for open lesions or macerations bilateral.   Vascular: Dorsalis Pedis and Posterior Tibial pulses palpable bilateral.  Capillary fill time is immediate to all digits.  Neurological: Epicritic and protective threshold intact bilateral.   Musculoskeletal: Tenderness to palpation to the plantar aspect of the right heel along the plantar fascia. All other joints range of motion within normal limits bilateral. Strength 5/5 in all groups bilateral.   Radiographic exam: Normal osseous mineralization. Joint spaces preserved. No fracture/dislocation/boney destruction. No other soft tissue abnormalities or radiopaque foreign bodies.   Assessment: 1. Plantar fasciitis right 2. Pain in right foot  Plan of Care:  1. Patient evaluated. Xrays reviewed.   2. Injection of 0.5cc Celestone soluspan injected into the right plantar fascia  3. Rx for Medrol Dose pack placed 4. Plantar fascial band(s) dispensed 5. Prescription for Meloxicam provided to patient.  6. Instructed patient regarding therapies and modalities at home to alleviate symptoms.  7. Return to clinic in 4 weeks.     Felecia Shelling, DPM [facility]  Dr. Felecia Shelling, DPM    [address] [place], [place] [address]                Office [phone]  Fax [phone]

## 2018-02-24 ENCOUNTER — Encounter: Payer: Self-pay | Admitting: Podiatry

## 2018-02-24 ENCOUNTER — Ambulatory Visit: Payer: BC Managed Care – PPO | Admitting: Podiatry

## 2018-02-24 DIAGNOSIS — M722 Plantar fascial fibromatosis: Secondary | ICD-10-CM | POA: Diagnosis not present

## 2018-02-25 NOTE — Progress Notes (Signed)
   Subjective: 47 year old female presenting today for follow up evaluation of plantar fasciitis of the right foot. She reports the pain has improved but is still present intermittently. She states the Meloxicam has helped alleviate the pain. Patient is here for further evaluation and treatment.   Past Medical History:  Diagnosis Date  . History of kidney stones   . Obesity   . Stomach ulcer      Objective: Physical Exam General: The patient is alert and oriented x3 in no acute distress.  Dermatology: Skin is warm, dry and supple bilateral lower extremities. Negative for open lesions or macerations bilateral.   Vascular: Dorsalis Pedis and Posterior Tibial pulses palpable bilateral.  Capillary fill time is immediate to all digits.  Neurological: Epicritic and protective threshold intact bilateral.   Musculoskeletal: Tenderness to palpation to the plantar aspect of the right heel along the plantar fascia. All other joints range of motion within normal limits bilateral. Strength 5/5 in all groups bilateral.    Assessment: 1. Plantar fasciitis right - improved   Plan of Care:  1. Patient evaluated.    2. Injection of 0.5cc Celestone soluspan injected into the right plantar fascia  3. Continue wearing fascial brace and taking Meloxicam.  4. Continue wearing New Balance shoes.  5. Return to clinic in 4 weeks.      Felecia Shelling, DPM [facility]  Dr. Felecia Shelling, DPM    [address] [place], [place] [address]                Office [phone]  Fax [phone]

## 2018-03-24 ENCOUNTER — Encounter: Payer: Self-pay | Admitting: Podiatry

## 2018-03-24 ENCOUNTER — Ambulatory Visit: Payer: BC Managed Care – PPO | Admitting: Podiatry

## 2018-03-24 DIAGNOSIS — M722 Plantar fascial fibromatosis: Secondary | ICD-10-CM | POA: Diagnosis not present

## 2018-03-26 NOTE — Progress Notes (Signed)
   Subjective: [age] year old female presenting today for follow up evaluation of plantar fasciitis of the right foot. She states the pain has improved from the initial onset. She has been wearing the fascial band and taking Meloxicam for treatment. There are no modifying factors noted. Patient is here for further evaluation and treatment.   Past Medical History:  Diagnosis Date  . History of kidney stones   . Obesity   . Stomach ulcer      Objective: Physical Exam General: The patient is alert and oriented x3 in no acute distress.  Dermatology: Skin is warm, dry and supple bilateral lower extremities. Negative for open lesions or macerations bilateral.   Vascular: Dorsalis Pedis and Posterior Tibial pulses palpable bilateral.  Capillary fill time is immediate to all digits.  Neurological: Epicritic and protective threshold intact bilateral.   Musculoskeletal: Tenderness to palpation to the plantar aspect of the right heel along the plantar fascia. All other joints range of motion within normal limits bilateral. Strength 5/5 in all groups bilateral.   Assessment: 1. Plantar fasciitis right - improved   Plan of Care:  1. Patient evaluated.  2. Declined injection today.  3. Continue wearing fascial brace and taking Meloxicam.  4. Continue wearing New Balance shoes.  5. Return to clinic as needed.    Felecia ShellingBrent M. Evans, DPM [facility]  Dr. Felecia ShellingBrent M. Evans, DPM    [address]                                        [place], [place] [id number]                Office [phone]  Fax [phone]) [phone]

## 2018-08-10 ENCOUNTER — Ambulatory Visit (INDEPENDENT_AMBULATORY_CARE_PROVIDER_SITE_OTHER): Payer: BC Managed Care – PPO | Admitting: Family Medicine

## 2018-08-10 ENCOUNTER — Encounter: Payer: Self-pay | Admitting: Family Medicine

## 2018-08-10 VITALS — BP 119/80 | HR 77 | Temp 98.7°F | Ht 61.5 in | Wt 249.2 lb

## 2018-08-10 DIAGNOSIS — Z0000 Encounter for general adult medical examination without abnormal findings: Secondary | ICD-10-CM

## 2018-08-10 DIAGNOSIS — Z1239 Encounter for other screening for malignant neoplasm of breast: Secondary | ICD-10-CM | POA: Diagnosis not present

## 2018-08-10 LAB — MICROSCOPIC EXAMINATION: Bacteria, UA: NONE SEEN

## 2018-08-10 LAB — BAYER DCA HB A1C WAIVED: HB A1C: 5 % (ref <7.0)

## 2018-08-10 LAB — UA/M W/RFLX CULTURE, ROUTINE
BILIRUBIN UA: NEGATIVE
GLUCOSE, UA: NEGATIVE
KETONES UA: NEGATIVE
Leukocytes, UA: NEGATIVE
NITRITE UA: NEGATIVE
PROTEIN UA: NEGATIVE
Specific Gravity, UA: 1.02 (ref 1.005–1.030)
UUROB: 0.2 mg/dL (ref 0.2–1.0)
pH, UA: 7 (ref 5.0–7.5)

## 2018-08-10 NOTE — Patient Instructions (Addendum)
[facility] at [facility]  Address: [address] [place], [place], [place] [address]  Phone: [phone]  Health Maintenance, Female Adopting a healthy lifestyle and getting preventive care can go a long way to promote health and wellness. Talk with your health care provider about what schedule of regular examinations is right for you. This is a good chance for you to check in with your provider about disease prevention and staying healthy. In between checkups, there are plenty of things you can do on your own. Experts have done a lot of research about which lifestyle changes and preventive measures are most likely to keep you healthy. Ask your health care provider for more information. Weight and diet Eat a healthy diet  Be sure to include plenty of vegetables, fruits, low-fat dairy products, and lean protein.  Do not eat a lot of foods high in solid fats, added sugars, or salt.  Get regular exercise. This is one of the most important things you can do for your health. ? Most adults should exercise for at least 150 minutes each week. The exercise should increase your heart rate and make you sweat (moderate-intensity exercise). ? Most adults should also do strengthening exercises at least twice a week. This is in addition to the moderate-intensity exercise.  Maintain a healthy weight  Body mass index (BMI) is a measurement that can be used to identify possible weight problems. It estimates body fat based on height and weight. Your health care provider can help determine your BMI and help you achieve or maintain a healthy weight.  For females 75 years of age and older: ? A BMI below 18.5 is considered underweight. ? A BMI of 18.5 to 24.9 is normal. ? A BMI of 25 to 29.9 is considered overweight. ? A BMI of 30 and above is considered obese.  Watch levels of cholesterol and blood lipids  You should start having your blood tested for lipids and cholesterol at 47 years of  age, then have this test every 5 years.  You may need to have your cholesterol levels checked more often if: ? Your lipid or cholesterol levels are high. ? You are older than 47 years of age. ? You are at high risk for heart disease.  Cancer screening Lung Cancer  Lung cancer screening is recommended for adults 52-5 years old who are at high risk for lung cancer because of a history of smoking.  A yearly low-dose CT scan of the lungs is recommended for people who: ? Currently smoke. ? Have quit within the past 15 years. ? Have at least a 30-pack-year history of smoking. A pack year is smoking an average of one pack of cigarettes a day for 1 year.  Yearly screening should continue until it has been 15 years since you quit.  Yearly screening should stop if you develop a health problem that would prevent you from having lung cancer treatment.  Breast Cancer  Practice breast self-awareness. This means understanding how your breasts normally appear and feel.  It also means doing regular breast self-exams. Let your health care provider know about any changes, no matter how small.  If you are in your 20s or 30s, you should have a clinical breast exam (CBE) by a health care provider every 1-3 years as part of a regular health exam.  If you are 9 or older, have a CBE every year. Also consider having a breast X-ray (mammogram) every year.  If you have a  family history of breast cancer, talk to your health care provider about genetic screening.  If you are at high risk for breast cancer, talk to your health care provider about having an MRI and a mammogram every year.  Breast cancer gene (BRCA) assessment is recommended for women who have family members with BRCA-related cancers. BRCA-related cancers include: ? Breast. ? Ovarian. ? Tubal. ? Peritoneal cancers.  Results of the assessment will determine the need for genetic counseling and BRCA1 and BRCA2 testing.  Cervical  Cancer Your health care provider may recommend that you be screened regularly for cancer of the pelvic organs (ovaries, uterus, and vagina). This screening involves a pelvic examination, including checking for microscopic changes to the surface of your cervix (Pap test). You may be encouraged to have this screening done every 3 years, beginning at age 21.  For women ages 30-65, health care providers may recommend pelvic exams and Pap testing every 3 years, or they may recommend the Pap and pelvic exam, combined with testing for human papilloma virus (HPV), every 5 years. Some types of HPV increase your risk of cervical cancer. Testing for HPV may also be done on women of any age with unclear Pap test results.  Other health care providers may not recommend any screening for nonpregnant women who are considered low risk for pelvic cancer and who do not have symptoms. Ask your health care provider if a screening pelvic exam is right for you.  If you have had past treatment for cervical cancer or a condition that could lead to cancer, you need Pap tests and screening for cancer for at least 20 years after your treatment. If Pap tests have been discontinued, your risk factors (such as having a new sexual partner) need to be reassessed to determine if screening should resume. Some women have medical problems that increase the chance of getting cervical cancer. In these cases, your health care provider may recommend more frequent screening and Pap tests.  Colorectal Cancer  This type of cancer can be detected and often prevented.  Routine colorectal cancer screening usually begins at 47 years of age and continues through 47 years of age.  Your health care provider may recommend screening at an earlier age if you have risk factors for colon cancer.  Your health care provider may also recommend using home test kits to check for hidden blood in the stool.  A small camera at the end of a tube can be used to  examine your colon directly (sigmoidoscopy or colonoscopy). This is done to check for the earliest forms of colorectal cancer.  Routine screening usually begins at age 50.  Direct examination of the colon should be repeated every 5-10 years through 47 years of age. However, you may need to be screened more often if early forms of precancerous polyps or small growths are found.  Skin Cancer  Check your skin from head to toe regularly.  Tell your health care provider about any new moles or changes in moles, especially if there is a change in a mole's shape or color.  Also tell your health care provider if you have a mole that is larger than the size of a pencil eraser.  Always use sunscreen. Apply sunscreen liberally and repeatedly throughout the day.  Protect yourself by wearing long sleeves, pants, a wide-brimmed hat, and sunglasses whenever you are outside.  Heart disease, diabetes, and high blood pressure  High blood pressure causes heart disease and increases the risk of   stroke. High blood pressure is more likely to develop in: ? People who have blood pressure in the high end of the normal range (130-139/85-89 mm Hg). ? People who are overweight or obese. ? People who are African American.  If you are 18-39 years of age, have your blood pressure checked every 3-5 years. If you are 40 years of age or older, have your blood pressure checked every year. You should have your blood pressure measured twice-once when you are at a hospital or clinic, and once when you are not at a hospital or clinic. Record the average of the two measurements. To check your blood pressure when you are not at a hospital or clinic, you can use: ? An automated blood pressure machine at a pharmacy. ? A home blood pressure monitor.  If you are between 55 years and 79 years old, ask your health care provider if you should take aspirin to prevent strokes.  Have regular diabetes screenings. This involves taking a  blood sample to check your fasting blood sugar level. ? If you are at a normal weight and have a low risk for diabetes, have this test once every three years after 47 years of age. ? If you are overweight and have a high risk for diabetes, consider being tested at a younger age or more often. Preventing infection Hepatitis B  If you have a higher risk for hepatitis B, you should be screened for this virus. You are considered at high risk for hepatitis B if: ? You were born in a country where hepatitis B is common. Ask your health care provider which countries are considered high risk. ? Your parents were born in a high-risk country, and you have not been immunized against hepatitis B (hepatitis B vaccine). ? You have HIV or AIDS. ? You use needles to inject street drugs. ? You live with someone who has hepatitis B. ? You have had sex with someone who has hepatitis B. ? You get hemodialysis treatment. ? You take certain medicines for conditions, including cancer, organ transplantation, and autoimmune conditions.  Hepatitis C  Blood testing is recommended for: ? Everyone born from 1945 through 1965. ? Anyone with known risk factors for hepatitis C.  Sexually transmitted infections (STIs)  You should be screened for sexually transmitted infections (STIs) including gonorrhea and chlamydia if: ? You are sexually active and are younger than 47 years of age. ? You are older than 47 years of age and your health care provider tells you that you are at risk for this type of infection. ? Your sexual activity has changed since you were last screened and you are at an increased risk for chlamydia or gonorrhea. Ask your health care provider if you are at risk.  If you do not have HIV, but are at risk, it may be recommended that you take a prescription medicine daily to prevent HIV infection. This is called pre-exposure prophylaxis (PrEP). You are considered at risk if: ? You are sexually active and  do not regularly use condoms or know the HIV status of your partner(s). ? You take drugs by injection. ? You are sexually active with a partner who has HIV.  Talk with your health care provider about whether you are at high risk of being infected with HIV. If you choose to begin PrEP, you should first be tested for HIV. You should then be tested every 3 months for as long as you are taking PrEP. Pregnancy  If   you are premenopausal and you may become pregnant, ask your health care provider about preconception counseling.  If you may become pregnant, take 400 to 800 micrograms (mcg) of folic acid every day.  If you want to prevent pregnancy, talk to your health care provider about birth control (contraception). Osteoporosis and menopause  Osteoporosis is a disease in which the bones lose minerals and strength with aging. This can result in serious bone fractures. Your risk for osteoporosis can be identified using a bone density scan.  If you are 23 years of age or older, or if you are at risk for osteoporosis and fractures, ask your health care provider if you should be screened.  Ask your health care provider whether you should take a calcium or vitamin D supplement to lower your risk for osteoporosis.  Menopause may have certain physical symptoms and risks.  Hormone replacement therapy may reduce some of these symptoms and risks. Talk to your health care provider about whether hormone replacement therapy is right for you. Follow these instructions at home:  Schedule regular health, dental, and eye exams.  Stay current with your immunizations.  Do not use any tobacco products including cigarettes, chewing tobacco, or electronic cigarettes.  If you are pregnant, do not drink alcohol.  If you are breastfeeding, limit how much and how often you drink alcohol.  Limit alcohol intake to no more than 1 drink per day for nonpregnant women. One drink equals 12 ounces of beer, 5 ounces of  wine, or 1 ounces of hard liquor.  Do not use street drugs.  Do not share needles.  Ask your health care provider for help if you need support or information about quitting drugs.  Tell your health care provider if you often feel depressed.  Tell your health care provider if you have ever been abused or do not feel safe at home. This information is not intended to replace advice given to you by your health care provider. Make sure you discuss any questions you have with your health care provider. Document Released: 04/01/2011 Document Revised: 02/22/2016 Document Reviewed: 06/20/2015 Elsevier Interactive Patient Education  [employer].

## 2018-08-10 NOTE — Progress Notes (Signed)
BP 119/80   Pulse 77   Temp 98.7 F (37.1 C) (Oral)   Ht 5' 1.5" (1.562 m)   Wt 249 lb 3.2 oz (113 kg)   LMP 08/06/2018 (Exact Date)   SpO2 97%   BMI 46.32 kg/m    Subjective:    Patient ID: Tanya Haas, female    DOB: [birth year], 47 y.o.   MRN: [id number]  HPI: Tanya Haas is a 47 y.o. female presenting on 08/10/2018 for comprehensive medical examination. Current medical complaints include:none  She currently lives with: husband and kids Menopausal Symptoms: no  Depression Screen done today and results listed below:  Depression screen [facility] 2/9 08/10/2018 08/07/2017 07/29/2016  Decreased Interest 0 0 1  Down, Depressed, Hopeless 0 0 0  PHQ - 2 Score 0 0 1  Altered sleeping 0 0 -  Tired, decreased energy 0 1 -  Change in appetite 0 1 -  Feeling bad or failure about yourself  0 0 -  Trouble concentrating 0 0 -  Moving slowly or fidgety/restless 0 0 -  Suicidal thoughts 0 0 -  PHQ-9 Score 0 2 -    Past Medical History:  Past Medical History:  Diagnosis Date  . History of kidney stones   . Obesity   . Stomach ulcer     Surgical History:  Past Surgical History:  Procedure Laterality Date  . KIDNEY STONE SURGERY      Medications:  No current outpatient medications on file prior to visit.   No current facility-administered medications on file prior to visit.     Allergies:  No Known Allergies  Social History:  Social History   Socioeconomic History  . Marital status: Married    Spouse name: Not on file  . Number of children: Not on file  . Years of education: Not on file  . Highest education level: Not on file  Occupational History  . Not on file  Social Needs  . Financial resource strain: Not on file  . Food insecurity:    Worry: Not on file    Inability: Not on file  . Transportation needs:    Medical: Not on file    Non-medical: Not on file  Tobacco Use  . Smoking status: Never Smoker  . Smokeless tobacco: Never Used    Substance and Sexual Activity  . Alcohol use: No  . Drug use: No  . Sexual activity: Yes    Birth control/protection: None, Other-see comments    Comment: Vasectomy for husband  Lifestyle  . Physical activity:    Days per week: Not on file    Minutes per session: Not on file  . Stress: Not on file  Relationships  . Social connections:    Talks on phone: Not on file    Gets together: Not on file    Attends religious service: Not on file    Active member of club or organization: Not on file    Attends meetings of clubs or organizations: Not on file    Relationship status: Not on file  . Intimate partner violence:    Fear of current or ex partner: Not on file    Emotionally abused: Not on file    Physically abused: Not on file    Forced sexual activity: Not on file  Other Topics Concern  . Not on file  Social History Narrative  . Not on file   Social History   Tobacco Use  Smoking Status Never  Smoker  Smokeless Tobacco Never Used   Social History   Substance and Sexual Activity  Alcohol Use No    Family History:  Family History  Problem Relation Age of Onset  . Cancer Mother        Uterine  . Heart disease Father   . Diabetes Maternal Grandmother   . Heart disease Maternal Grandmother   . Heart disease Paternal Grandmother   . AAA (abdominal aortic aneurysm) Paternal Grandfather     Past medical history, surgical history, medications, allergies, family history and social history reviewed with patient today and changes made to appropriate areas of the chart.   Review of Systems  Constitutional: Negative.   HENT: Negative.   Eyes: Negative.   Respiratory: Negative.   Cardiovascular: Positive for leg swelling. Negative for chest pain, palpitations, orthopnea, claudication and PND.  Gastrointestinal: Negative.   Genitourinary: Negative.   Musculoskeletal: Negative.   Skin: Negative.   Neurological: Negative.   Endo/Heme/Allergies: Negative.    Psychiatric/Behavioral: Negative.     All other ROS negative except what is listed above and in the HPI.      Objective:    BP 119/80   Pulse 77   Temp 98.7 F (37.1 C) (Oral)   Ht 5' 1.5" (1.562 m)   Wt 249 lb 3.2 oz (113 kg)   LMP 08/06/2018 (Exact Date)   SpO2 97%   BMI 46.32 kg/m   Wt Readings from Last 3 Encounters:  08/10/18 249 lb 3.2 oz (113 kg)  08/07/17 236 lb 5 oz (107.2 kg)  06/20/17 236 lb (107 kg)    Physical Exam  Constitutional: She is oriented to person, place, and time. She appears well-developed and well-nourished. No distress.  HENT:  Head: Normocephalic and atraumatic.  Right Ear: Hearing, tympanic membrane, external ear and ear canal normal.  Left Ear: Hearing, tympanic membrane, external ear and ear canal normal.  Nose: Nose normal.  Mouth/Throat: Uvula is midline, oropharynx is clear and moist and mucous membranes are normal. No oropharyngeal exudate.  Eyes: Pupils are equal, round, and reactive to light. Conjunctivae, EOM and lids are normal. Right eye exhibits no discharge. Left eye exhibits no discharge. No scleral icterus.  Neck: Normal range of motion. Neck supple. No JVD present. No tracheal deviation present. No thyromegaly present.  Cardiovascular: Normal rate, regular rhythm, normal heart sounds and intact distal pulses. Exam reveals no gallop and no friction rub.  No murmur heard. Pulmonary/Chest: Effort normal and breath sounds normal. No stridor. No respiratory distress. She has no wheezes. She has no rales. She exhibits no tenderness.  Abdominal: Soft. Bowel sounds are normal. She exhibits no distension and no mass. There is no tenderness. There is no rebound and no guarding. No hernia.  Musculoskeletal: Normal range of motion. She exhibits no edema, tenderness or deformity.  Lymphadenopathy:    She has no cervical adenopathy.  Neurological: She is alert and oriented to person, place, and time. She displays normal reflexes. No cranial  nerve deficit or sensory deficit. She exhibits normal muscle tone. Coordination normal.  Skin: Skin is warm, dry and intact. Capillary refill takes less than 2 seconds. No rash noted. She is not diaphoretic. No erythema. No pallor.  Psychiatric: She has a normal mood and affect. Her speech is normal and behavior is normal. Judgment and thought content normal. Cognition and memory are normal.  Nursing note and vitals reviewed. Breast exam done today with Tiffany Reel, CMA in attendance.   Results for orders  placed or performed in visit on 08/07/17  CBC with Differential/Platelet  Result Value Ref Range   WBC 12.5 (H) 3.4 - 10.8 x10E3/uL   RBC 4.17 3.77 - 5.28 x10E6/uL   Hemoglobin 12.3 11.1 - 15.9 g/dL   Hematocrit 16.1 09.6 - 46.6 %   MCV 88 79 - 97 fL   MCH 29.5 26.6 - 33.0 pg   MCHC 33.4 31.5 - 35.7 g/dL   RDW 04.5 40.9 - 81.1 %   Platelets 384 (H) 150 - 379 x10E3/uL   Neutrophils 72 Not Estab. %   Lymphs 23 Not Estab. %   Monocytes 5 Not Estab. %   Eos 0 Not Estab. %   Basos 0 Not Estab. %   Neutrophils Absolute 9.0 (H) 1.4 - 7.0 x10E3/uL   Lymphocytes Absolute 2.8 0.7 - 3.1 x10E3/uL   Monocytes Absolute 0.6 0.1 - 0.9 x10E3/uL   EOS (ABSOLUTE) 0.0 0.0 - 0.4 x10E3/uL   Basophils Absolute 0.0 0.0 - 0.2 x10E3/uL   Immature Granulocytes 0 Not Estab. %   Immature Grans (Abs) 0.0 0.0 - 0.1 x10E3/uL  Comprehensive metabolic panel  Result Value Ref Range   Glucose 88 65 - 99 mg/dL   BUN 20 6 - 24 mg/dL   Creatinine, Ser 9.14 0.57 - 1.00 mg/dL   GFR calc non Af Amer 99 >59 mL/min/1.73   GFR calc Af Amer 114 >59 mL/min/1.73   BUN/Creatinine Ratio 27 (H) 9 - 23   Sodium 138 134 - 144 mmol/L   Potassium 4.6 3.5 - 5.2 mmol/L   Chloride 100 96 - 106 mmol/L   CO2 27 20 - 29 mmol/L   Calcium 9.8 8.7 - 10.2 mg/dL   Total Protein 7.0 6.0 - 8.5 g/dL   Albumin 4.2 3.5 - 5.5 g/dL   Globulin, Total 2.8 1.5 - 4.5 g/dL   Albumin/Globulin Ratio 1.5 1.2 - 2.2   Bilirubin Total 0.6 0.0 - 1.2  mg/dL   Alkaline Phosphatase 46 39 - 117 IU/L   AST 13 0 - 40 IU/L   ALT 11 0 - 32 IU/L  Lipid Panel w/o Chol/HDL Ratio  Result Value Ref Range   Cholesterol, Total 188 100 - 199 mg/dL   Triglycerides 782 (H) 0 - 149 mg/dL   HDL 53 >95 mg/dL   VLDL Cholesterol Cal 61 (H) 5 - 40 mg/dL   LDL Calculated 74 0 - 99 mg/dL  TSH  Result Value Ref Range   TSH 2.100 0.450 - 4.500 uIU/mL  UA/M w/rflx Culture, Routine  Result Value Ref Range   Specific Gravity, UA 1.025 1.005 - 1.030   pH, UA 5.5 5.0 - 7.5   Color, UA Yellow Yellow   Appearance Ur Hazy (A) Clear   Leukocytes, UA Negative Negative   Protein, UA Negative Negative/Trace   Glucose, UA Negative Negative   Ketones, UA Negative Negative   RBC, UA Negative Negative   Bilirubin, UA Negative Negative   Urobilinogen, Ur 0.2 0.2 - 1.0 mg/dL   Nitrite, UA Negative Negative  IGP, Aptima HPV, rfx 16/18,45  Result Value Ref Range   DIAGNOSIS: Comment    Specimen adequacy: Comment    Clinician Provided ICD10 Comment    Performed by: Comment    QC reviewed by: Comment    PAP Smear Comment .    Note: Comment    Test Methodology Comment    HPV Aptima Negative Negative      Assessment & Plan:   Problem List Items  Addressed This Visit    None    Visit Diagnoses    Routine general medical examination at a health care facility    -  Primary   Vaccines up to date. Screening labs checked today. Continue diet and exercise. Continue to monitor. Call with any concerns.    Relevant Orders   Bayer DCA Hb A1c Waived   CBC with Differential/Platelet   Comprehensive metabolic panel   Lipid Panel w/o Chol/HDL Ratio   TSH   UA/M w/rflx Culture, Routine   Screening for breast cancer       Mammogram ordered today.   Relevant Orders   MM DIGITAL SCREENING BILATERAL       Follow up plan: Return in about 1 year (around 08/11/2019) for Physical.   LABORATORY TESTING:  - Pap smear: up to date  IMMUNIZATIONS:   - Tdap: Tetanus  vaccination status reviewed: last tetanus booster within 10 years. - Influenza: Refused - Pneumovax: Not applicable  SCREENING: -Mammogram: Due in January- order placed    PATIENT COUNSELING:   Advised to take 1 mg of folate supplement per day if capable of pregnancy.   Sexuality: Discussed sexually transmitted diseases, partner selection, use of condoms, avoidance of unintended pregnancy  and contraceptive alternatives.   Advised to avoid cigarette smoking.  I discussed with the patient that most people either abstain from alcohol or drink within safe limits (<=14/week and <=4 drinks/occasion for males, <=7/weeks and <= 3 drinks/occasion for females) and that the risk for alcohol disorders and other health effects rises proportionally with the number of drinks per week and how often a drinker exceeds daily limits.  Discussed cessation/primary prevention of drug use and availability of treatment for abuse.   Diet: Encouraged to adjust caloric intake to maintain  or achieve ideal body weight, to reduce intake of dietary saturated fat and total fat, to limit sodium intake by avoiding high sodium foods and not adding table salt, and to maintain adequate dietary potassium and calcium preferably from fresh fruits, vegetables, and low-fat dairy products.    stressed the importance of regular exercise  Injury prevention: Discussed safety belts, safety helmets, smoke detector, smoking near bedding or upholstery.   Dental health: Discussed importance of regular tooth brushing, flossing, and dental visits.    NEXT PREVENTATIVE PHYSICAL DUE IN 1 YEAR. Return in about 1 year (around 08/11/2019) for Physical.

## 2018-08-11 LAB — COMPREHENSIVE METABOLIC PANEL
A/G RATIO: 2.1 (ref 1.2–2.2)
ALT: 8 IU/L (ref 0–32)
AST: 12 IU/L (ref 0–40)
Albumin: 3.9 g/dL (ref 3.5–5.5)
Alkaline Phosphatase: 51 IU/L (ref 39–117)
BILIRUBIN TOTAL: 0.2 mg/dL (ref 0.0–1.2)
BUN/Creatinine Ratio: 18 (ref 9–23)
BUN: 10 mg/dL (ref 6–24)
CALCIUM: 9.2 mg/dL (ref 8.7–10.2)
CHLORIDE: 102 mmol/L (ref 96–106)
CO2: 23 mmol/L (ref 20–29)
Creatinine, Ser: 0.55 mg/dL — ABNORMAL LOW (ref 0.57–1.00)
GFR calc Af Amer: 129 mL/min/{1.73_m2} (ref >59)
GFR, EST NON AFRICAN AMERICAN: 112 mL/min/{1.73_m2} (ref >59)
GLUCOSE: 80 mg/dL (ref 65–99)
Globulin, Total: 1.9 g/dL (ref 1.5–4.5)
POTASSIUM: 4 mmol/L (ref 3.5–5.2)
Sodium: 140 mmol/L (ref 134–144)
Total Protein: 5.8 g/dL — ABNORMAL LOW (ref 6.0–8.5)

## 2018-08-11 LAB — CBC WITH DIFFERENTIAL/PLATELET
BASOS ABS: 0 10*3/uL (ref 0.0–0.2)
BASOS: 1 %
EOS (ABSOLUTE): 0.2 10*3/uL (ref 0.0–0.4)
Eos: 2 %
Hematocrit: 33.7 % — ABNORMAL LOW (ref 34.0–46.6)
Hemoglobin: 11.8 g/dL (ref 11.1–15.9)
IMMATURE GRANULOCYTES: 0 %
Immature Grans (Abs): 0 10*3/uL (ref 0.0–0.1)
Lymphocytes Absolute: 2.1 10*3/uL (ref 0.7–3.1)
Lymphs: 26 %
MCH: 29.9 pg (ref 26.6–33.0)
MCHC: 35 g/dL (ref 31.5–35.7)
MCV: 86 fL (ref 79–97)
Monocytes Absolute: 0.4 10*3/uL (ref 0.1–0.9)
Monocytes: 5 %
NEUTROS PCT: 66 %
Neutrophils Absolute: 5.4 10*3/uL (ref 1.4–7.0)
PLATELETS: 342 10*3/uL (ref 150–450)
RBC: 3.94 x10E6/uL (ref 3.77–5.28)
RDW: 12.8 % (ref 12.3–15.4)
WBC: 8.2 10*3/uL (ref 3.4–10.8)

## 2018-08-11 LAB — LIPID PANEL W/O CHOL/HDL RATIO
Cholesterol, Total: 196 mg/dL (ref 100–199)
HDL: 41 mg/dL (ref >39)
TRIGLYCERIDES: 453 mg/dL — AB (ref 0–149)

## 2018-08-11 LAB — TSH: TSH: 3.89 u[IU]/mL (ref 0.450–4.500)

## 2018-08-29 ENCOUNTER — Telehealth: Payer: BC Managed Care – PPO | Admitting: Family

## 2018-08-29 DIAGNOSIS — J208 Acute bronchitis due to other specified organisms: Secondary | ICD-10-CM

## 2018-08-29 DIAGNOSIS — B9689 Other specified bacterial agents as the cause of diseases classified elsewhere: Secondary | ICD-10-CM

## 2018-08-29 MED ORDER — PREDNISONE 10 MG (21) PO TBPK: ORAL_TABLET | ORAL | 0 refills | Status: DC

## 2018-08-29 MED ORDER — AZITHROMYCIN 250 MG PO TABS: ORAL_TABLET | ORAL | 0 refills | Status: DC

## 2018-08-29 NOTE — Progress Notes (Signed)
We are sorry that you are not feeling well.  Here is how we plan to help!  Based on your presentation I believe you most likely have A cough due to bacteria.  When patients have a fever and a productive cough with a change in color or increased sputum production, we are concerned about bacterial bronchitis.  If left untreated it can progress to pneumonia.  If your symptoms do not improve with your treatment plan it is important that you contact your provider.   I have prescribed Azithromyin 250 mg: two tablets now and then one tablet daily for 4 additonal days    In addition you may use A non-prescription cough medication called Mucinex DM: take 2 tablets every 12 hours.  Prednisone 10 mg daily for 6 days (see taper instructions below)  From your responses in the eVisit questionnaire you describe inflammation in the upper respiratory tract which is causing a significant cough.  This is commonly called Bronchitis and has four common causes:    Allergies  Viral Infections  Acid Reflux  Bacterial Infection Allergies, viruses and acid reflux are treated by controlling symptoms or eliminating the cause. An example might be a cough caused by taking certain blood pressure medications. You stop the cough by changing the medication. Another example might be a cough caused by acid reflux. Controlling the reflux helps control the cough.  USE OF BRONCHODILATOR ("RESCUE") INHALERS: There is a risk from using your bronchodilator too frequently.  The risk is that over-reliance on a medication which only relaxes the muscles surrounding the breathing tubes can reduce the effectiveness of medications prescribed to reduce swelling and congestion of the tubes themselves.  Although you feel brief relief from the bronchodilator inhaler, your asthma may actually be worsening with the tubes becoming more swollen and filled with mucus.  This can delay other crucial treatments, such as oral steroid medications. If you  need to use a bronchodilator inhaler daily, several times per day, you should discuss this with your provider.  There are probably better treatments that could be used to keep your asthma under control.     HOME CARE . Only take medications as instructed by your medical team. . Complete the entire course of an antibiotic. . Drink plenty of fluids and get plenty of rest. . Avoid close contacts especially the very young and the elderly . Cover your mouth if you cough or cough into your sleeve. . Always remember to wash your hands . A steam or ultrasonic humidifier can help congestion.   GET HELP RIGHT AWAY IF: . You develop worsening fever. . You become short of breath . You cough up blood. . Your symptoms persist after you have completed your treatment plan MAKE SURE YOU   Understand these instructions.  Will watch your condition.  Will get help right away if you are not doing well or get worse.  Your e-visit answers were reviewed by a board certified advanced clinical practitioner to complete your personal care plan.  Depending on the condition, your plan could have included both over the counter or prescription medications. If there is a problem please reply  once you have received a response from your provider. Your safety is important to us.  If you have drug allergies check your prescription carefully.    You can use MyChart to ask questions about today's visit, request a non-urgent call back, or ask for a work or school excuse for 24 hours related to this e-Visit. If   it has been greater than 24 hours you will need to follow up with your provider, or enter a new e-Visit to address those concerns. You will get an e-mail in the next two days asking about your experience.  I hope that your e-visit has been valuable and will speed your recovery. Thank you for using e-visits.   

## 2018-09-13 ENCOUNTER — Telehealth: Payer: Self-pay | Admitting: Family

## 2018-09-13 DIAGNOSIS — R059 Cough, unspecified: Secondary | ICD-10-CM

## 2018-09-13 DIAGNOSIS — R05 Cough: Secondary | ICD-10-CM

## 2018-09-13 NOTE — Progress Notes (Signed)
Based on what you shared with me it looks like you have a serious condition that should be evaluated in a face to face office visit.  NOTE: If you entered your credit card information for this eVisit, you will not be charged. You may see a "hold" on your card for the $30 but that hold will drop off and you will not have a charge processed.  I am sorry that you are not feeling better! Since you are not feeling better, I believe it would be best to be seen face to face to rule out pneumonia.    If you are having a true medical emergency please call 911.  If you need an urgent face to face visit, [place] has four urgent care centers for your convenience.  If you need care fast and have a high deductible or no insurance consider:   [email or web address] to reserve your spot online an avoid wait times  [facility] [address], [address] [place], [place] [address] 8 am to 8 pm Monday-Friday 10 am to 4 pm Saturday-Sunday *Across the street from [employer]  [facility]  [address] [place], [id number] 8 am to 5 pm Monday-Friday * In the [facility] on the [facility]   The following sites will take your  insurance:  . [facility]  [phone] Get Driving Directions Find a Provider at this Location  [address] [place], [place] [address] . 10 am to 8 pm Monday-Friday . 12 pm to 8 pm Saturday-Sunday   . [facility] at [facility]  [phone] Get Driving Directions Find a Provider at this Location  [address] [place], [place] [address] . 8 am to 8 pm Monday-Friday . 9 am to 6 pm Saturday . 11 am to 6 pm Sunday   . [facility] at [facility] [place]  [phone] Get Driving Directions  [id number] [place], [place] [address] . 8 am to 8 pm Monday-Friday . 8 am to 4 pm Saturday-Sunday   Your e-visit answers were reviewed by a board  certified advanced clinical practitioner to complete your personal care plan.  Thank you for using e-Visits.

## 2018-09-14 ENCOUNTER — Ambulatory Visit: Payer: BC Managed Care – PPO | Admitting: Family Medicine

## 2018-09-14 ENCOUNTER — Encounter: Payer: Self-pay | Admitting: Family Medicine

## 2018-09-14 VITALS — BP 127/80 | HR 77 | Temp 99.0°F | Wt 249.4 lb

## 2018-09-14 DIAGNOSIS — J208 Acute bronchitis due to other specified organisms: Secondary | ICD-10-CM

## 2018-09-14 DIAGNOSIS — N393 Stress incontinence (female) (male): Secondary | ICD-10-CM

## 2018-09-14 DIAGNOSIS — B9689 Other specified bacterial agents as the cause of diseases classified elsewhere: Secondary | ICD-10-CM

## 2018-09-14 MED ORDER — PREDNISONE 10 MG PO TABS: ORAL_TABLET | ORAL | 0 refills | Status: DC

## 2018-09-14 MED ORDER — DOXYCYCLINE HYCLATE 100 MG PO TABS: 100.0000 mg | ORAL_TABLET | Freq: Two times a day (BID) | ORAL | 0 refills | Status: DC

## 2018-09-14 NOTE — Patient Instructions (Signed)

## 2018-09-14 NOTE — Progress Notes (Signed)
BP 127/80   Pulse 77   Temp 99 F (37.2 C) (Oral)   Wt 249 lb 6.4 oz (113.1 kg)   SpO2 96%   BMI 46.36 kg/m    Subjective:    Patient ID: Tanya Haas, female    DOB: [birth year], 47 y.o.   MRN: [id number]  HPI: Tanya Haas is a 47 y.o. female  Chief Complaint  Patient presents with  . URI    pt states she has had a cough, runny/stuffy nose, congestion and a headache. States she took prednisone and a zpack a few weeks ago from an e-visit, felt a little better but started feeling worse again    UPPER RESPIRATORY TRACT INFECTION Duration: 6 weeks- cough didn't go away, then got worse on Friday Worst symptom: cough Fever: unknown Cough: yes Shortness of breath: yes Wheezing: yes Chest pain: no Chest tightness: no Chest congestion: yes Nasal congestion: yes Runny nose: yes Post nasal drip: yes Sneezing: yes Sore throat: no Swollen glands: no Sinus pressure: no Headache: no Face pain: no Toothache: no Ear pain: no  Ear pressure: no  Eyes red/itching:no Eye drainage/crusting: no  Vomiting: no Rash: no Fatigue: yes Sick contacts: yes Strep contacts: no  Context: worse Recurrent sinusitis: no Relief with OTC cold/cough medications: no  Treatments attempted: cold/sinus and mucinex   Relevant past medical, surgical, family and social history reviewed and updated as indicated. Interim medical history since our last visit reviewed. Allergies and medications reviewed and updated.  Review of Systems  Constitutional: Positive for diaphoresis and fatigue. Negative for activity change, appetite change, chills, fever and unexpected weight change.  HENT: Positive for congestion, postnasal drip and rhinorrhea. Negative for dental problem, drooling, ear discharge, ear pain, facial swelling, hearing loss, mouth sores, nosebleeds, sinus pressure, sinus pain, sneezing, sore throat, tinnitus, trouble swallowing and voice change.   Eyes: Negative.   Respiratory:  Positive for cough, shortness of breath and wheezing. Negative for apnea, choking, chest tightness and stridor.   Cardiovascular: Negative.   Gastrointestinal: Negative.   Genitourinary:       Has been losing control of her bladder when she coughs  Musculoskeletal: Negative.   Psychiatric/Behavioral: Negative.     Per HPI unless specifically indicated above     Objective:    BP 127/80   Pulse 77   Temp 99 F (37.2 C) (Oral)   Wt 249 lb 6.4 oz (113.1 kg)   SpO2 96%   BMI 46.36 kg/m   Wt Readings from Last 3 Encounters:  09/14/18 249 lb 6.4 oz (113.1 kg)  08/10/18 249 lb 3.2 oz (113 kg)  08/07/17 236 lb 5 oz (107.2 kg)    Physical Exam Vitals signs and nursing note reviewed.  Constitutional:      General: She is not in acute distress.    Appearance: Normal appearance. She is not ill-appearing, toxic-appearing or diaphoretic.  HENT:     Head: Normocephalic and atraumatic.     Right Ear: Tympanic membrane, ear canal and external ear normal. There is no impacted cerumen.     Left Ear: Tympanic membrane, ear canal and external ear normal. There is no impacted cerumen.     Nose: Congestion and rhinorrhea present.     Mouth/Throat:     Mouth: Mucous membranes are moist.     Pharynx: Oropharynx is clear. No oropharyngeal exudate or posterior oropharyngeal erythema.  Eyes:     General: No scleral icterus.       Right  eye: No discharge.        Left eye: No discharge.     Extraocular Movements: Extraocular movements intact.     Conjunctiva/sclera: Conjunctivae normal.     Pupils: Pupils are equal, round, and reactive to light.  Neck:     Musculoskeletal: Normal range of motion and neck supple. No neck rigidity or muscular tenderness.     Vascular: No carotid bruit.  Cardiovascular:     Rate and Rhythm: Normal rate and regular rhythm.     Pulses: Normal pulses.     Heart sounds: Normal heart sounds. No murmur. No friction rub. No gallop.   Pulmonary:     Effort: Pulmonary  effort is normal. No respiratory distress.     Breath sounds: No stridor. Wheezing (RUL) present. No rhonchi or rales.  Chest:     Chest wall: No tenderness.  Musculoskeletal: Normal range of motion.  Lymphadenopathy:     Cervical: Cervical adenopathy present.  Skin:    General: Skin is warm and dry.     Capillary Refill: Capillary refill takes less than 2 seconds.     Coloration: Skin is not jaundiced or pale.     Findings: No bruising, erythema, lesion or rash.  Neurological:     General: No focal deficit present.     Mental Status: She is alert and oriented to person, place, and time. Mental status is at baseline.     Cranial Nerves: No cranial nerve deficit.     Sensory: No sensory deficit.     Motor: No weakness.     Coordination: Coordination normal.     Gait: Gait normal.     Deep Tendon Reflexes: Reflexes normal.  Psychiatric:        Mood and Affect: Mood normal.        Behavior: Behavior normal.        Thought Content: Thought content normal.        Judgment: Judgment normal.     Results for orders placed or performed in visit on 08/10/18  Microscopic Examination  Result Value Ref Range   WBC, UA 0-5 0 - 5 /hpf   RBC, UA 0-2 0 - 2 /hpf   Epithelial Cells (non renal) 0-10 0 - 10 /hpf   Bacteria, UA None seen None seen/Few  Bayer DCA Hb A1c Waived  Result Value Ref Range   HB A1C (BAYER DCA - WAIVED) 5.0 <7.0 %  CBC with Differential/Platelet  Result Value Ref Range   WBC 8.2 3.4 - 10.8 x10E3/uL   RBC 3.94 3.77 - 5.28 x10E6/uL   Hemoglobin 11.8 11.1 - 15.9 g/dL   Hematocrit 13.233.7 (L) 44.034.0 - 46.6 %   MCV 86 79 - 97 fL   MCH 29.9 26.6 - 33.0 pg   MCHC 35.0 31.5 - 35.7 g/dL   RDW 10.212.8 72.512.3 - 36.615.4 %   Platelets 342 150 - 450 x10E3/uL   Neutrophils 66 Not Estab. %   Lymphs 26 Not Estab. %   Monocytes 5 Not Estab. %   Eos 2 Not Estab. %   Basos 1 Not Estab. %   Neutrophils Absolute 5.4 1.4 - 7.0 x10E3/uL   Lymphocytes Absolute 2.1 0.7 - 3.1 x10E3/uL    Monocytes Absolute 0.4 0.1 - 0.9 x10E3/uL   EOS (ABSOLUTE) 0.2 0.0 - 0.4 x10E3/uL   Basophils Absolute 0.0 0.0 - 0.2 x10E3/uL   Immature Granulocytes 0 Not Estab. %   Immature Grans (Abs) 0.0 0.0 - 0.1 x10E3/uL  Comprehensive metabolic panel  Result Value Ref Range   Glucose 80 65 - 99 mg/dL   BUN 10 6 - 24 mg/dL   Creatinine, Ser 1.61 (L) 0.57 - 1.00 mg/dL   GFR calc non Af Amer 112 >59 mL/min/1.73   GFR calc Af Amer 129 >59 mL/min/1.73   BUN/Creatinine Ratio 18 9 - 23   Sodium 140 134 - 144 mmol/L   Potassium 4.0 3.5 - 5.2 mmol/L   Chloride 102 96 - 106 mmol/L   CO2 23 20 - 29 mmol/L   Calcium 9.2 8.7 - 10.2 mg/dL   Total Protein 5.8 (L) 6.0 - 8.5 g/dL   Albumin 3.9 3.5 - 5.5 g/dL   Globulin, Total 1.9 1.5 - 4.5 g/dL   Albumin/Globulin Ratio 2.1 1.2 - 2.2   Bilirubin Total 0.2 0.0 - 1.2 mg/dL   Alkaline Phosphatase 51 39 - 117 IU/L   AST 12 0 - 40 IU/L   ALT 8 0 - 32 IU/L  Lipid Panel w/o Chol/HDL Ratio  Result Value Ref Range   Cholesterol, Total 196 100 - 199 mg/dL   Triglycerides 096 (H) 0 - 149 mg/dL   HDL 41 >04 mg/dL   VLDL Cholesterol Cal Comment 5 - 40 mg/dL   LDL Calculated Comment 0 - 99 mg/dL  TSH  Result Value Ref Range   TSH 3.890 0.450 - 4.500 uIU/mL  UA/M w/rflx Culture, Routine  Result Value Ref Range   Specific Gravity, UA 1.020 1.005 - 1.030   pH, UA 7.0 5.0 - 7.5   Color, UA Yellow Yellow   Appearance Ur Cloudy (A) Clear   Leukocytes, UA Negative Negative   Protein, UA Negative Negative/Trace   Glucose, UA Negative Negative   Ketones, UA Negative Negative   RBC, UA 3+ (A) Negative   Bilirubin, UA Negative Negative   Urobilinogen, Ur 0.2 0.2 - 1.0 mg/dL   Nitrite, UA Negative Negative   Microscopic Examination See below:       Assessment & Plan:   Problem List Items Addressed This Visit    None    Visit Diagnoses    Acute bacterial bronchitis    -  Primary   Will treat with prednisone and doxycycline. Call if not getting better. Recheck  lungs 2 weeks.    Stress incontinence       Discussed kegels. If not getting better or getting worse, let us know.        Follow up plan: Return in about 2 weeks (around 09/28/2018) for lung recheck.

## 2018-10-01 ENCOUNTER — Ambulatory Visit (INDEPENDENT_AMBULATORY_CARE_PROVIDER_SITE_OTHER): Payer: BC Managed Care – PPO | Admitting: Family Medicine

## 2018-10-01 ENCOUNTER — Encounter: Payer: Self-pay | Admitting: Family Medicine

## 2018-10-01 VITALS — BP 107/75 | HR 73 | Temp 97.8°F | Ht 60.0 in | Wt 255.6 lb

## 2018-10-01 DIAGNOSIS — R059 Cough, unspecified: Secondary | ICD-10-CM

## 2018-10-01 DIAGNOSIS — R05 Cough: Secondary | ICD-10-CM

## 2018-10-01 MED ORDER — ALBUTEROL SULFATE HFA 108 (90 BASE) MCG/ACT IN AERS: 2.0000 | INHALATION_SPRAY | Freq: Four times a day (QID) | RESPIRATORY_TRACT | 1 refills | Status: DC | PRN

## 2018-10-01 NOTE — Progress Notes (Signed)
BP 107/75 (BP Location: Left Arm, Patient Position: Sitting, Cuff Size: Normal)   Pulse 73   Temp 97.8 F (36.6 C) (Oral)   Ht 5' (1.524 m)   Wt 255 lb 9.6 oz (115.9 kg)   SpO2 98%   BMI 49.92 kg/m    Subjective:    Patient ID: Tanya Haas, female    DOB: 12/27/1970, 48 y.o.   MRN: [id number]  HPI: Tanya Haas is a 48 y.o. female  Chief Complaint  Patient presents with  . Lung Recheck    Bronchitis 09/14/2018. Finished medication. Patient states her cough is better but gone completely.   Tanya Haas is feeling much better. She notes that her lungs are better, but her cough is not gone completely. She is coughing several times a day- about 1x an hour. Coming in a burst, not always dry. No fevers. No SOB. Still not feeling like herself- but definitely feeling better.   Relevant past medical, surgical, family and social history reviewed and updated as indicated. Interim medical history since our last visit reviewed. Allergies and medications reviewed and updated.  Review of Systems  Constitutional: Negative.   HENT: Negative.   Respiratory: Positive for cough. Negative for apnea, choking, chest tightness, shortness of breath, wheezing and stridor.   Cardiovascular: Negative.   Psychiatric/Behavioral: Negative.     Per HPI unless specifically indicated above     Objective:    BP 107/75 (BP Location: Left Arm, Patient Position: Sitting, Cuff Size: Normal)   Pulse 73   Temp 97.8 F (36.6 C) (Oral)   Ht 5' (1.524 m)   Wt 255 lb 9.6 oz (115.9 kg)   SpO2 98%   BMI 49.92 kg/m   Wt Readings from Last 3 Encounters:  10/01/18 255 lb 9.6 oz (115.9 kg)  09/14/18 249 lb 6.4 oz (113.1 kg)  08/10/18 249 lb 3.2 oz (113 kg)    Physical Exam Vitals signs and nursing note reviewed.  Constitutional:      General: She is not in acute distress.    Appearance: Normal appearance. She is not ill-appearing, toxic-appearing or diaphoretic.  HENT:     Head: Normocephalic and  atraumatic.     Right Ear: External ear normal.     Left Ear: External ear normal.     Nose: Nose normal.     Mouth/Throat:     Mouth: Mucous membranes are moist.     Pharynx: Oropharynx is clear.  Eyes:     General: No scleral icterus.       Right eye: No discharge.        Left eye: No discharge.     Extraocular Movements: Extraocular movements intact.     Conjunctiva/sclera: Conjunctivae normal.     Pupils: Pupils are equal, round, and reactive to light.  Neck:     Musculoskeletal: Normal range of motion and neck supple.  Cardiovascular:     Rate and Rhythm: Normal rate and regular rhythm.     Pulses: Normal pulses.     Heart sounds: Normal heart sounds. No murmur. No friction rub. No gallop.   Pulmonary:     Effort: Pulmonary effort is normal. No respiratory distress.     Breath sounds: Normal breath sounds. No stridor. No wheezing, rhonchi or rales.  Chest:     Chest wall: No tenderness.  Musculoskeletal: Normal range of motion.  Skin:    General: Skin is warm and dry.     Capillary Refill: Capillary refill takes  less than 2 seconds.     Coloration: Skin is not jaundiced or pale.     Findings: No bruising, erythema, lesion or rash.  Neurological:     General: No focal deficit present.     Mental Status: She is alert and oriented to person, place, and time. Mental status is at baseline.  Psychiatric:        Mood and Affect: Mood normal.        Behavior: Behavior normal.        Thought Content: Thought content normal.        Judgment: Judgment normal.     Results for orders placed or performed in visit on 08/10/18  Microscopic Examination  Result Value Ref Range   WBC, UA 0-5 0 - 5 /hpf   RBC, UA 0-2 0 - 2 /hpf   Epithelial Cells (non renal) 0-10 0 - 10 /hpf   Bacteria, UA None seen None seen/Few  Bayer DCA Hb A1c Waived  Result Value Ref Range   HB A1C (BAYER DCA - WAIVED) 5.0 <7.0 %  CBC with Differential/Platelet  Result Value Ref Range   WBC 8.2 3.4 - 10.8  x10E3/uL   RBC 3.94 3.77 - 5.28 x10E6/uL   Hemoglobin 11.8 11.1 - 15.9 g/dL   Hematocrit 63.0 (L) 16.0 - 46.6 %   MCV 86 79 - 97 fL   MCH 29.9 26.6 - 33.0 pg   MCHC 35.0 31.5 - 35.7 g/dL   RDW 10.9 32.3 - 55.7 %   Platelets 342 150 - 450 x10E3/uL   Neutrophils 66 Not Estab. %   Lymphs 26 Not Estab. %   Monocytes 5 Not Estab. %   Eos 2 Not Estab. %   Basos 1 Not Estab. %   Neutrophils Absolute 5.4 1.4 - 7.0 x10E3/uL   Lymphocytes Absolute 2.1 0.7 - 3.1 x10E3/uL   Monocytes Absolute 0.4 0.1 - 0.9 x10E3/uL   EOS (ABSOLUTE) 0.2 0.0 - 0.4 x10E3/uL   Basophils Absolute 0.0 0.0 - 0.2 x10E3/uL   Immature Granulocytes 0 Not Estab. %   Immature Grans (Abs) 0.0 0.0 - 0.1 x10E3/uL  Comprehensive metabolic panel  Result Value Ref Range   Glucose 80 65 - 99 mg/dL   BUN 10 6 - 24 mg/dL   Creatinine, Ser 3.22 (L) 0.57 - 1.00 mg/dL   GFR calc non Af Amer 112 >59 mL/min/1.73   GFR calc Af Amer 129 >59 mL/min/1.73   BUN/Creatinine Ratio 18 9 - 23   Sodium 140 134 - 144 mmol/L   Potassium 4.0 3.5 - 5.2 mmol/L   Chloride 102 96 - 106 mmol/L   CO2 23 20 - 29 mmol/L   Calcium 9.2 8.7 - 10.2 mg/dL   Total Protein 5.8 (L) 6.0 - 8.5 g/dL   Albumin 3.9 3.5 - 5.5 g/dL   Globulin, Total 1.9 1.5 - 4.5 g/dL   Albumin/Globulin Ratio 2.1 1.2 - 2.2   Bilirubin Total 0.2 0.0 - 1.2 mg/dL   Alkaline Phosphatase 51 39 - 117 IU/L   AST 12 0 - 40 IU/L   ALT 8 0 - 32 IU/L  Lipid Panel w/o Chol/HDL Ratio  Result Value Ref Range   Cholesterol, Total 196 100 - 199 mg/dL   Triglycerides 025 (H) 0 - 149 mg/dL   HDL 41 >42 mg/dL   VLDL Cholesterol Cal Comment 5 - 40 mg/dL   LDL Calculated Comment 0 - 99 mg/dL  TSH  Result Value Ref Range  TSH 3.890 0.450 - 4.500 uIU/mL  UA/M w/rflx Culture, Routine  Result Value Ref Range   Specific Gravity, UA 1.020 1.005 - 1.030   pH, UA 7.0 5.0 - 7.5   Color, UA Yellow Yellow   Appearance Ur Cloudy (A) Clear   Leukocytes, UA Negative Negative   Protein, UA Negative  Negative/Trace   Glucose, UA Negative Negative   Ketones, UA Negative Negative   RBC, UA 3+ (A) Negative   Bilirubin, UA Negative Negative   Urobilinogen, Ur 0.2 0.2 - 1.0 mg/dL   Nitrite, UA Negative Negative   Microscopic Examination See below:       Assessment & Plan:   Problem List Items Addressed This Visit    None    Visit Diagnoses    Cough    -  Primary   Spiro normal. Likely post-infectious cough. Will start albuterol. Call if not getting better or getting worse. Continue to monitor.    Relevant Orders   Spirometry with Graph (Completed)       Follow up plan: Return As scheduled.

## 2018-10-20 ENCOUNTER — Ambulatory Visit
Admission: RE | Admit: 2018-10-20 | Discharge: 2018-10-20 | Disposition: A | Discharge status: Home / Self Care | Payer: BC Managed Care – PPO | Source: Ambulatory Visit | Attending: Family Medicine | Admitting: Family Medicine

## 2018-10-20 DIAGNOSIS — Z1239 Encounter for other screening for malignant neoplasm of breast: Secondary | ICD-10-CM | POA: Diagnosis not present

## 2019-08-12 ENCOUNTER — Encounter: Payer: BC Managed Care – PPO | Admitting: Family Medicine

## 2019-09-09 ENCOUNTER — Telehealth: Payer: BC Managed Care – PPO | Admitting: Physician Assistant

## 2019-09-09 DIAGNOSIS — J020 Streptococcal pharyngitis: Secondary | ICD-10-CM | POA: Diagnosis not present

## 2019-09-09 MED ORDER — AMOXICILLIN 500 MG PO CAPS: 500.0000 mg | ORAL_CAPSULE | Freq: Two times a day (BID) | ORAL | 0 refills | Status: DC

## 2019-09-09 NOTE — Progress Notes (Signed)

## 2019-09-20 ENCOUNTER — Other Ambulatory Visit: Payer: Self-pay

## 2019-09-20 ENCOUNTER — Encounter

## 2019-09-20 ENCOUNTER — Encounter: Payer: Self-pay | Admitting: Family Medicine

## 2019-09-20 ENCOUNTER — Ambulatory Visit (INDEPENDENT_AMBULATORY_CARE_PROVIDER_SITE_OTHER): Payer: BC Managed Care – PPO | Admitting: Family Medicine

## 2019-09-20 VITALS — BP 131/84 | HR 79 | Temp 97.9°F | Ht 61.0 in | Wt 239.6 lb

## 2019-09-20 DIAGNOSIS — Z1231 Encounter for screening mammogram for malignant neoplasm of breast: Secondary | ICD-10-CM

## 2019-09-20 DIAGNOSIS — Z0000 Encounter for general adult medical examination without abnormal findings: Secondary | ICD-10-CM

## 2019-09-20 NOTE — Patient Instructions (Signed)
Health Maintenance, Female Adopting a healthy lifestyle and getting preventive care are important in promoting health and wellness. Ask your health care provider about:  The right schedule for you to have regular tests and exams.  Things you can do on your own to prevent diseases and keep yourself healthy. What should I know about diet, weight, and exercise? Eat a healthy diet   Eat a diet that includes plenty of vegetables, fruits, low-fat dairy products, and lean protein.  Do not eat a lot of foods that are high in solid fats, added sugars, or sodium. Maintain a healthy weight Body mass index (BMI) is used to identify weight problems. It estimates body fat based on height and weight. Your health care provider can help determine your BMI and help you achieve or maintain a healthy weight. Get regular exercise Get regular exercise. This is one of the most important things you can do for your health. Most adults should:  Exercise for at least 150 minutes each week. The exercise should increase your heart rate and make you sweat (moderate-intensity exercise).  Do strengthening exercises at least twice a week. This is in addition to the moderate-intensity exercise.  Spend less time sitting. Even light physical activity can be beneficial. Watch cholesterol and blood lipids Have your blood tested for lipids and cholesterol at 48 years of age, then have this test every 5 years. Have your cholesterol levels checked more often if:  Your lipid or cholesterol levels are high.  You are older than 48 years of age.  You are at high risk for heart disease. What should I know about cancer screening? Depending on your health history and family history, you may need to have cancer screening at various ages. This may include screening for:  Breast cancer.  Cervical cancer.  Colorectal cancer.  Skin cancer.  Lung cancer. What should I know about heart disease, diabetes, and high blood  pressure? Blood pressure and heart disease  High blood pressure causes heart disease and increases the risk of stroke. This is more likely to develop in people who have high blood pressure readings, are of African descent, or are overweight.  Have your blood pressure checked: ? Every 3-5 years if you are 18-39 years of age. ? Every year if you are 40 years old or older. Diabetes Have regular diabetes screenings. This checks your fasting blood sugar level. Have the screening done:  Once every three years after age 40 if you are at a normal weight and have a low risk for diabetes.  More often and at a younger age if you are overweight or have a high risk for diabetes. What should I know about preventing infection? Hepatitis B If you have a higher risk for hepatitis B, you should be screened for this virus. Talk with your health care provider to find out if you are at risk for hepatitis B infection. Hepatitis C Testing is recommended for:  Everyone born from 1945 through 1965.  Anyone with known risk factors for hepatitis C. Sexually transmitted infections (STIs)  Get screened for STIs, including gonorrhea and chlamydia, if: ? You are sexually active and are younger than 48 years of age. ? You are older than 48 years of age and your health care provider tells you that you are at risk for this type of infection. ? Your sexual activity has changed since you were last screened, and you are at increased risk for chlamydia or gonorrhea. Ask your health care provider if   you are at risk.  Ask your health care provider about whether you are at high risk for HIV. Your health care provider may recommend a prescription medicine to help prevent HIV infection. If you choose to take medicine to prevent HIV, you should first get tested for HIV. You should then be tested every 3 months for as long as you are taking the medicine. Pregnancy  If you are about to stop having your period (premenopausal) and  you may become pregnant, seek counseling before you get pregnant.  Take 400 to 800 micrograms (mcg) of folic acid every day if you become pregnant.  Ask for birth control (contraception) if you want to prevent pregnancy. Osteoporosis and menopause Osteoporosis is a disease in which the bones lose minerals and strength with aging. This can result in bone fractures. If you are 65 years old or older, or if you are at risk for osteoporosis and fractures, ask your health care provider if you should:  Be screened for bone loss.  Take a calcium or vitamin D supplement to lower your risk of fractures.  Be given hormone replacement therapy (HRT) to treat symptoms of menopause. Follow these instructions at home: Lifestyle  Do not use any products that contain nicotine or tobacco, such as cigarettes, e-cigarettes, and chewing tobacco. If you need help quitting, ask your health care provider.  Do not use street drugs.  Do not share needles.  Ask your health care provider for help if you need support or information about quitting drugs. Alcohol use  Do not drink alcohol if: ? Your health care provider tells you not to drink. ? You are pregnant, may be pregnant, or are planning to become pregnant.  If you drink alcohol: ? Limit how much you use to 0-1 drink a day. ? Limit intake if you are breastfeeding.  Be aware of how much alcohol is in your drink. In the [place] one drink equals one 12 oz bottle of beer (355 mL), one 5 oz glass of wine (148 mL), or one 1 oz glass of hard liquor (44 mL). General instructions  Schedule regular health, dental, and eye exams.  Stay current with your vaccines.  Tell your health care provider if: ? You often feel depressed. ? You have ever been abused or do not feel safe at home. Summary  Adopting a healthy lifestyle and getting preventive care are important in promoting health and wellness.  Follow your health care provider's instructions about healthy  diet, exercising, and getting tested or screened for diseases.  Follow your health care provider's instructions on monitoring your cholesterol and blood pressure. This information is not intended to replace advice given to you by your health care provider. Make sure you discuss any questions you have with your health care provider. Document Released: 04/01/2011 Document Revised: 09/09/2018 Document Reviewed: 09/09/2018 Elsevier Patient Education  2020 [employer]  

## 2019-09-20 NOTE — Progress Notes (Signed)
BP 131/84 (BP Location: Left Arm, Patient Position: Sitting, Cuff Size: Normal)   Pulse 79   Temp 97.9 F (36.6 C) (Oral)   Ht 5' 1"  (1.549 m)   Wt 239 lb 9.6 oz (108.7 kg)   SpO2 99%   BMI 45.27 kg/m    Subjective:    Patient ID: Tanya Haas, female    DOB: Feb 25, 1971, 48 y.o.   MRN: [id number]  HPI: Tanya Haas is a 48 y.o. female presenting on 09/20/2019 for comprehensive medical examination. Current medical complaints include:none  She currently lives with: husband and kids Menopausal Symptoms: no  Depression Screen done today and results listed below:  Depression screen [facility] 2/9 09/20/2019 08/10/2018 08/07/2017 07/29/2016  Decreased Interest 0 0 0 1  Down, Depressed, Hopeless 0 0 0 0  PHQ - 2 Score 0 0 0 1  Altered sleeping 0 0 0 -  Tired, decreased energy 0 0 1 -  Change in appetite 0 0 1 -  Feeling bad or failure about yourself  0 0 0 -  Trouble concentrating 0 0 0 -  Moving slowly or fidgety/restless 0 0 0 -  Suicidal thoughts 0 0 0 -  PHQ-9 Score 0 0 2 -  Difficult doing work/chores Not difficult at all - - -    Past Medical History:  Past Medical History:  Diagnosis Date  . History of kidney stones   . Obesity   . Stomach ulcer     Surgical History:  Past Surgical History:  Procedure Laterality Date  . KIDNEY STONE SURGERY      Medications:  No current outpatient medications on file prior to visit.   No current facility-administered medications on file prior to visit.    Allergies:  No Known Allergies  Social History:  Social History   Socioeconomic History  . Marital status: Married    Spouse name: Not on file  . Number of children: Not on file  . Years of education: Not on file  . Highest education level: Not on file  Occupational History  . Not on file  Tobacco Use  . Smoking status: Current Some Day Smoker    Packs/day: 0.25    Types: Cigarettes  . Smokeless tobacco: Never Used  Substance and Sexual Activity  .  Alcohol use: No  . Drug use: No  . Sexual activity: Yes    Birth control/protection: None, Other-see comments    Comment: Vasectomy for husband  Other Topics Concern  . Not on file  Social History Narrative  . Not on file   Social Determinants of Health   Financial Resource Strain:   . Difficulty of Paying Living Expenses: Not on file  Food Insecurity:   . Worried About Charity fundraiser in the Last Year: Not on file  . Ran Out of Food in the Last Year: Not on file  Transportation Needs:   . Lack of Transportation (Medical): Not on file  . Lack of Transportation (Non-Medical): Not on file  Physical Activity:   . Days of Exercise per Week: Not on file  . Minutes of Exercise per Session: Not on file  Stress:   . Feeling of Stress : Not on file  Social Connections:   . Frequency of Communication with Friends and Family: Not on file  . Frequency of Social Gatherings with Friends and Family: Not on file  . Attends Religious Services: Not on file  . Active Member of Clubs or Organizations: Not  on file  . Attends Archivist Meetings: Not on file  . Marital Status: Not on file  Intimate Partner Violence:   . Fear of Current or Ex-Partner: Not on file  . Emotionally Abused: Not on file  . Physically Abused: Not on file  . Sexually Abused: Not on file   Social History   Tobacco Use  Smoking Status Current Some Day Smoker  . Packs/day: 0.25  . Types: Cigarettes  Smokeless Tobacco Never Used   Social History   Substance and Sexual Activity  Alcohol Use No    Family History:  Family History  Problem Relation Age of Onset  . Cancer Mother        Uterine  . Heart disease Father   . Diabetes Maternal Grandmother   . Heart disease Maternal Grandmother   . Heart disease Paternal Grandmother   . AAA (abdominal aortic aneurysm) Paternal Grandfather   . Breast cancer Neg Hx     Past medical history, surgical history, medications, allergies, family history and  social history reviewed with patient today and changes made to appropriate areas of the chart.   Review of Systems  Constitutional: Negative.   HENT: Negative.   Eyes: Negative.   Respiratory: Negative.   Cardiovascular: Positive for palpitations and leg swelling. Negative for chest pain, orthopnea, claudication and PND.  Gastrointestinal: Negative for abdominal pain, blood in stool, constipation, diarrhea, heartburn, melena, nausea and vomiting.  Genitourinary: Negative.   Musculoskeletal: Negative.   Skin: Negative.   Neurological: Negative.   Endo/Heme/Allergies: Negative.   Psychiatric/Behavioral: Negative.     All other ROS negative except what is listed above and in the HPI.      Objective:    BP 131/84 (BP Location: Left Arm, Patient Position: Sitting, Cuff Size: Normal)   Pulse 79   Temp 97.9 F (36.6 C) (Oral)   Ht 5' 1"  (1.549 m)   Wt 239 lb 9.6 oz (108.7 kg)   SpO2 99%   BMI 45.27 kg/m   Wt Readings from Last 3 Encounters:  09/20/19 239 lb 9.6 oz (108.7 kg)  10/01/18 255 lb 9.6 oz (115.9 kg)  09/14/18 249 lb 6.4 oz (113.1 kg)    Physical Exam Vitals and nursing note reviewed. Exam conducted with a chaperone present.  Constitutional:      General: She is not in acute distress.    Appearance: Normal appearance. She is obese. She is not ill-appearing, toxic-appearing or diaphoretic.  HENT:     Head: Normocephalic and atraumatic.     Right Ear: Tympanic membrane, ear canal and external ear normal. There is no impacted cerumen.     Left Ear: Tympanic membrane, ear canal and external ear normal. There is no impacted cerumen.     Nose: Nose normal. No congestion or rhinorrhea.     Mouth/Throat:     Mouth: Mucous membranes are moist.     Pharynx: Oropharynx is clear. No oropharyngeal exudate or posterior oropharyngeal erythema.  Eyes:     General: No scleral icterus.       Right eye: No discharge.        Left eye: No discharge.     Extraocular Movements:  Extraocular movements intact.     Conjunctiva/sclera: Conjunctivae normal.     Pupils: Pupils are equal, round, and reactive to light.  Neck:     Vascular: No carotid bruit.  Cardiovascular:     Rate and Rhythm: Normal rate and regular rhythm.  Pulses: Normal pulses.     Heart sounds: No murmur. No friction rub. No gallop.   Pulmonary:     Effort: Pulmonary effort is normal. No respiratory distress.     Breath sounds: Normal breath sounds. No stridor. No wheezing, rhonchi or rales.  Chest:     Chest wall: No tenderness.     Breasts:        Right: Normal. No swelling, bleeding, inverted nipple, mass, nipple discharge, skin change or tenderness.        Left: Normal. No swelling, bleeding, inverted nipple, mass, nipple discharge, skin change or tenderness.  Abdominal:     General: Abdomen is flat. Bowel sounds are normal. There is no distension.     Palpations: Abdomen is soft. There is no mass.     Tenderness: There is no abdominal tenderness. There is no right CVA tenderness, left CVA tenderness, guarding or rebound.     Hernia: No hernia is present.  Genitourinary:    Comments: Pelvic exams deferred with shared decision making Musculoskeletal:        General: No swelling, tenderness, deformity or signs of injury.     Cervical back: Normal range of motion and neck supple. No rigidity. No muscular tenderness.     Right lower leg: No edema.     Left lower leg: No edema.  Lymphadenopathy:     Cervical: No cervical adenopathy.     Upper Body:     Right upper body: No supraclavicular, axillary or pectoral adenopathy.     Left upper body: No supraclavicular, axillary or pectoral adenopathy.  Skin:    General: Skin is warm and dry.     Capillary Refill: Capillary refill takes less than 2 seconds.     Coloration: Skin is not jaundiced or pale.     Findings: No bruising, erythema, lesion or rash.  Neurological:     General: No focal deficit present.     Mental Status: She is alert  and oriented to person, place, and time. Mental status is at baseline.     Cranial Nerves: No cranial nerve deficit.     Sensory: No sensory deficit.     Motor: No weakness.     Coordination: Coordination normal.     Gait: Gait normal.     Deep Tendon Reflexes: Reflexes normal.  Psychiatric:        Mood and Affect: Mood normal.        Behavior: Behavior normal.        Thought Content: Thought content normal.        Judgment: Judgment normal.     Results for orders placed or performed in visit on 08/10/18  Microscopic Examination   BLD  Result Value Ref Range   WBC, UA 0-5 0 - 5 /hpf   RBC, UA 0-2 0 - 2 /hpf   Epithelial Cells (non renal) 0-10 0 - 10 /hpf   Bacteria, UA None seen None seen/Few  Bayer DCA Hb A1c Waived  Result Value Ref Range   HB A1C (BAYER DCA - WAIVED) 5.0 <7.0 %  CBC with Differential/Platelet  Result Value Ref Range   WBC 8.2 3.4 - 10.8 x10E3/uL   RBC 3.94 3.77 - 5.28 x10E6/uL   Hemoglobin 11.8 11.1 - 15.9 g/dL   Hematocrit 33.7 (L) 34.0 - 46.6 %   MCV 86 79 - 97 fL   MCH 29.9 26.6 - 33.0 pg   MCHC 35.0 31.5 - 35.7 g/dL   RDW 12.8  12.3 - 15.4 %   Platelets 342 150 - 450 x10E3/uL   Neutrophils 66 Not Estab. %   Lymphs 26 Not Estab. %   Monocytes 5 Not Estab. %   Eos 2 Not Estab. %   Basos 1 Not Estab. %   Neutrophils Absolute 5.4 1.4 - 7.0 x10E3/uL   Lymphocytes Absolute 2.1 0.7 - 3.1 x10E3/uL   Monocytes Absolute 0.4 0.1 - 0.9 x10E3/uL   EOS (ABSOLUTE) 0.2 0.0 - 0.4 x10E3/uL   Basophils Absolute 0.0 0.0 - 0.2 x10E3/uL   Immature Granulocytes 0 Not Estab. %   Immature Grans (Abs) 0.0 0.0 - 0.1 x10E3/uL  Comprehensive metabolic panel  Result Value Ref Range   Glucose 80 65 - 99 mg/dL   BUN 10 6 - 24 mg/dL   Creatinine, Ser 0.55 (L) 0.57 - 1.00 mg/dL   GFR calc non Af Amer 112 >59 mL/min/1.73   GFR calc Af Amer 129 >59 mL/min/1.73   BUN/Creatinine Ratio 18 9 - 23   Sodium 140 134 - 144 mmol/L   Potassium 4.0 3.5 - 5.2 mmol/L   Chloride 102  96 - 106 mmol/L   CO2 23 20 - 29 mmol/L   Calcium 9.2 8.7 - 10.2 mg/dL   Total Protein 5.8 (L) 6.0 - 8.5 g/dL   Albumin 3.9 3.5 - 5.5 g/dL   Globulin, Total 1.9 1.5 - 4.5 g/dL   Albumin/Globulin Ratio 2.1 1.2 - 2.2   Bilirubin Total 0.2 0.0 - 1.2 mg/dL   Alkaline Phosphatase 51 39 - 117 IU/L   AST 12 0 - 40 IU/L   ALT 8 0 - 32 IU/L  Lipid Panel w/o Chol/HDL Ratio  Result Value Ref Range   Cholesterol, Total 196 100 - 199 mg/dL   Triglycerides 453 (H) 0 - 149 mg/dL   HDL 41 >39 mg/dL   VLDL Cholesterol Cal Comment 5 - 40 mg/dL   LDL Calculated Comment 0 - 99 mg/dL  TSH  Result Value Ref Range   TSH 3.890 0.450 - 4.500 uIU/mL  UA/M w/rflx Culture, Routine   Specimen: Blood   BLD  Result Value Ref Range   Specific Gravity, UA 1.020 1.005 - 1.030   pH, UA 7.0 5.0 - 7.5   Color, UA Yellow Yellow   Appearance Ur Cloudy (A) Clear   Leukocytes, UA Negative Negative   Protein, UA Negative Negative/Trace   Glucose, UA Negative Negative   Ketones, UA Negative Negative   RBC, UA 3+ (A) Negative   Bilirubin, UA Negative Negative   Urobilinogen, Ur 0.2 0.2 - 1.0 mg/dL   Nitrite, UA Negative Negative   Microscopic Examination See below:       Assessment & Plan:   Problem List Items Addressed This Visit    None    Visit Diagnoses    Routine general medical examination at a health care facility    -  Primary   Vaccines up to date/declined. Screening labs checked today. Pap and mammogram up to date. Continue diet and exercise. Call with any concerns.    Relevant Orders   CBC with Differential OUT   Comp Met (CMET)   HIV antibody (with reflex)   Lipid Panel w/o Chol/HDL Ratio OUT   TSH   UA/M w/rflx Culture, Routine   Bayer DCA Hb A1c Waived   Encounter for screening mammogram for malignant neoplasm of breast       Mammogram ordered today. Call with any concerns.    Relevant Orders  MM 3D SCREEN BREAST BILATERAL       Follow up plan: Return in about 1 year (around  09/19/2020) for Physical.   LABORATORY TESTING:  - Pap smear: up to date  IMMUNIZATIONS:   - Tdap: Tetanus vaccination status reviewed: last tetanus booster within 10 years. - Influenza: Refused - Pneumovax: Not applicable  SCREENING: -Mammogram: Ordered today   PATIENT COUNSELING:   Advised to take 1 mg of folate supplement per day if capable of pregnancy.   Sexuality: Discussed sexually transmitted diseases, partner selection, use of condoms, avoidance of unintended pregnancy  and contraceptive alternatives.   Advised to avoid cigarette smoking.  I discussed with the patient that most people either abstain from alcohol or drink within safe limits (<=14/week and <=4 drinks/occasion for males, <=7/weeks and <= 3 drinks/occasion for females) and that the risk for alcohol disorders and other health effects rises proportionally with the number of drinks per week and how often a drinker exceeds daily limits.  Discussed cessation/primary prevention of drug use and availability of treatment for abuse.   Diet: Encouraged to adjust caloric intake to maintain  or achieve ideal body weight, to reduce intake of dietary saturated fat and total fat, to limit sodium intake by avoiding high sodium foods and not adding table salt, and to maintain adequate dietary potassium and calcium preferably from fresh fruits, vegetables, and low-fat dairy products.    stressed the importance of regular exercise  Injury prevention: Discussed safety belts, safety helmets, smoke detector, smoking near bedding or upholstery.   Dental health: Discussed importance of regular tooth brushing, flossing, and dental visits.    NEXT PREVENTATIVE PHYSICAL DUE IN 1 YEAR. Return in about 1 year (around 09/19/2020) for Physical.

## 2019-09-21 LAB — CBC WITH DIFFERENTIAL/PLATELET
Basophils Absolute: 0 10*3/uL (ref 0.0–0.2)
Basos: 0 %
EOS (ABSOLUTE): 0.2 10*3/uL (ref 0.0–0.4)
Eos: 2 %
Hematocrit: 35 % (ref 34.0–46.6)
Hemoglobin: 11.8 g/dL (ref 11.1–15.9)
Immature Grans (Abs): 0 10*3/uL (ref 0.0–0.1)
Immature Granulocytes: 0 %
Lymphocytes Absolute: 2 10*3/uL (ref 0.7–3.1)
Lymphs: 26 %
MCH: 30.1 pg (ref 26.6–33.0)
MCHC: 33.7 g/dL (ref 31.5–35.7)
MCV: 89 fL (ref 79–97)
Monocytes Absolute: 0.4 10*3/uL (ref 0.1–0.9)
Monocytes: 5 %
Neutrophils Absolute: 5.3 10*3/uL (ref 1.4–7.0)
Neutrophils: 67 %
Platelets: 309 10*3/uL (ref 150–450)
RBC: 3.92 x10E6/uL (ref 3.77–5.28)
RDW: 13 % (ref 11.7–15.4)
WBC: 7.9 10*3/uL (ref 3.4–10.8)

## 2019-09-21 LAB — COMPREHENSIVE METABOLIC PANEL
ALT: 11 IU/L (ref 0–32)
AST: 14 IU/L (ref 0–40)
Albumin/Globulin Ratio: 1.8 (ref 1.2–2.2)
Albumin: 4 g/dL (ref 3.8–4.8)
Alkaline Phosphatase: 53 IU/L (ref 39–117)
BUN/Creatinine Ratio: 18 (ref 9–23)
BUN: 13 mg/dL (ref 6–24)
Bilirubin Total: 0.3 mg/dL (ref 0.0–1.2)
CO2: 24 mmol/L (ref 20–29)
Calcium: 8.8 mg/dL (ref 8.7–10.2)
Chloride: 106 mmol/L (ref 96–106)
Creatinine, Ser: 0.72 mg/dL (ref 0.57–1.00)
GFR calc Af Amer: 115 mL/min/{1.73_m2} (ref >59)
GFR calc non Af Amer: 99 mL/min/{1.73_m2} (ref >59)
Globulin, Total: 2.2 g/dL (ref 1.5–4.5)
Glucose: 86 mg/dL (ref 65–99)
Potassium: 4.2 mmol/L (ref 3.5–5.2)
Sodium: 142 mmol/L (ref 134–144)
Total Protein: 6.2 g/dL (ref 6.0–8.5)

## 2019-09-21 LAB — LIPID PANEL W/O CHOL/HDL RATIO
Cholesterol, Total: 184 mg/dL (ref 100–199)
HDL: 50 mg/dL (ref >39)
LDL Chol Calc (NIH): 97 mg/dL (ref 0–99)
Triglycerides: 218 mg/dL — ABNORMAL HIGH (ref 0–149)
VLDL Cholesterol Cal: 37 mg/dL (ref 5–40)

## 2019-09-21 LAB — TSH: TSH: 2.86 u[IU]/mL (ref 0.450–4.500)

## 2019-09-21 LAB — HIV ANTIBODY (ROUTINE TESTING W REFLEX): HIV Screen 4th Generation wRfx: NONREACTIVE

## 2019-09-23 ENCOUNTER — Other Ambulatory Visit: Payer: Self-pay | Admitting: Family Medicine

## 2019-09-23 ENCOUNTER — Telehealth: Payer: Self-pay | Admitting: Family Medicine

## 2019-09-23 LAB — UA/M W/RFLX CULTURE, ROUTINE
Bilirubin, UA: NEGATIVE
Glucose, UA: NEGATIVE
Ketones, UA: NEGATIVE
Nitrite, UA: NEGATIVE
Protein,UA: NEGATIVE
RBC, UA: NEGATIVE
Specific Gravity, UA: 1.02 (ref 1.005–1.030)
Urobilinogen, Ur: 0.2 mg/dL (ref 0.2–1.0)
pH, UA: 7 (ref 5.0–7.5)

## 2019-09-23 LAB — MICROSCOPIC EXAMINATION: RBC, Urine: NONE SEEN /hpf (ref 0–2)

## 2019-09-23 LAB — URINE CULTURE, REFLEX

## 2019-09-23 LAB — BAYER DCA HB A1C WAIVED: HB A1C (BAYER DCA - WAIVED): 5.1 % (ref <7.0)

## 2019-09-23 MED ORDER — CIPROFLOXACIN HCL 500 MG PO TABS: 500.0000 mg | ORAL_TABLET | Freq: Two times a day (BID) | ORAL | 0 refills | Status: DC

## 2019-09-23 NOTE — Telephone Encounter (Signed)
Please let her know- Tanya Haas, it looks like you have a UTI. I've sent you through an antibiotic over to your pharmacy. Have a great day!

## 2019-09-23 NOTE — Telephone Encounter (Signed)
Called patient. Left VM for patient to return call to the office. 

## 2019-09-27 NOTE — Telephone Encounter (Signed)
Tried to call patient. LVM for patient to return call.

## 2019-09-28 NOTE — Telephone Encounter (Signed)
Called patient to discuss lab results, no answer, left a voicemail for patient to return my call.  ?  ? ? ?

## 2019-09-28 NOTE — Telephone Encounter (Signed)
Patient notified, she states that she has already picked the medication up.

## 2020-01-25 ENCOUNTER — Encounter: Payer: Self-pay | Admitting: Family Medicine

## 2020-01-25 ENCOUNTER — Ambulatory Visit (INDEPENDENT_AMBULATORY_CARE_PROVIDER_SITE_OTHER): Payer: BC Managed Care – PPO | Admitting: Family Medicine

## 2020-01-25 VITALS — BP 134/83 | HR 72 | Temp 97.4°F | Wt 232.0 lb

## 2020-01-25 DIAGNOSIS — R300 Dysuria: Secondary | ICD-10-CM

## 2020-01-25 LAB — UA/M W/RFLX CULTURE, ROUTINE
Bilirubin, UA: NEGATIVE
Glucose, UA: NEGATIVE
Ketones, UA: NEGATIVE
Leukocytes,UA: NEGATIVE
Nitrite, UA: NEGATIVE
Protein,UA: NEGATIVE
RBC, UA: NEGATIVE
Specific Gravity, UA: <1.005 — ABNORMAL LOW (ref 1.005–1.030)
Urobilinogen, Ur: 0.2 mg/dL (ref 0.2–1.0)
pH, UA: 6 (ref 5.0–7.5)

## 2020-01-25 NOTE — Progress Notes (Signed)
BP 134/83 (BP Location: Left Arm, Patient Position: Sitting, Cuff Size: Normal)   Pulse 72   Temp (!) 97.4 F (36.3 C) (Oral)   Wt 232 lb (105.2 kg)   SpO2 97%   BMI 43.84 kg/m    Subjective:    Patient ID: Tanya Haas, female    DOB: November 25, 1970, 49 y.o.   MRN: [id number]  HPI: Danicia Terhaar is a 49 y.o. female  Chief Complaint  Patient presents with  . Urinary Tract Infection   URINARY SYMPTOMS Duration: a week or 2 Dysuria: burning Urinary frequency: yes Urgency: no Small volume voids: yes Symptom severity: mild Urinary incontinence: no Foul odor: yes Hematuria: no Abdominal pain: no Back pain: no Suprapubic pain/pressure: no Flank pain: no Fever:  no Vomiting: no Relief with cranberry juice: no Relief with pyridium: no Status: better/worse/stable Previous urinary tract infection: yes Recurrent urinary tract infection: no Sexual activity: monogomous History of sexually transmitted disease: no Vaginal discharge: no Treatments attempted: increasing fluids   Relevant past medical, surgical, family and social history reviewed and updated as indicated. Interim medical history since our last visit reviewed. Allergies and medications reviewed and updated.  Review of Systems  Constitutional: Negative.   Respiratory: Negative.   Cardiovascular: Negative.   Gastrointestinal: Negative.   Genitourinary: Positive for dysuria and frequency. Negative for decreased urine volume, difficulty urinating, dyspareunia, enuresis, flank pain, genital sores, hematuria, menstrual problem, pelvic pain, urgency, vaginal bleeding, vaginal discharge and vaginal pain.  Psychiatric/Behavioral: Negative.     Per HPI unless specifically indicated above     Objective:    BP 134/83 (BP Location: Left Arm, Patient Position: Sitting, Cuff Size: Normal)   Pulse 72   Temp (!) 97.4 F (36.3 C) (Oral)   Wt 232 lb (105.2 kg)   SpO2 97%   BMI 43.84 kg/m   Wt Readings from  Last 3 Encounters:  01/25/20 232 lb (105.2 kg)  09/20/19 239 lb 9.6 oz (108.7 kg)  10/01/18 255 lb 9.6 oz (115.9 kg)    Physical Exam Constitutional:      General: She is not in acute distress.    Appearance: She is well-developed.  HENT:     Head: Normocephalic and atraumatic.     Right Ear: Hearing normal.     Left Ear: Hearing normal.     Nose: Nose normal.  Eyes:     General: Lids are normal. No scleral icterus.       Right eye: No discharge.        Left eye: No discharge.     Conjunctiva/sclera: Conjunctivae normal.  Pulmonary:     Effort: Pulmonary effort is normal. No respiratory distress.  Musculoskeletal:        General: Normal range of motion.  Skin:    Findings: No rash.  Neurological:     Mental Status: She is alert and oriented to person, place, and time.  Psychiatric:        Speech: Speech normal.        Behavior: Behavior normal.        Thought Content: Thought content normal.        Judgment: Judgment normal.     Results for orders placed or performed in visit on 09/20/19  Microscopic Examination   BLD  Result Value Ref Range   WBC, UA 0-5 0 - 5 /hpf   RBC None seen 0 - 2 /hpf   Epithelial Cells (non renal) 0-10 0 - 10 /hpf  Bacteria, UA Few (A) None seen/Few  Urine Culture, Reflex   BLD  Result Value Ref Range   Urine Culture, Routine Final report (A)    Organism ID, Bacteria Escherichia coli (A)    Antimicrobial Susceptibility Comment   CBC with Differential OUT  Result Value Ref Range   WBC 7.9 3.4 - 10.8 x10E3/uL   RBC 3.92 3.77 - 5.28 x10E6/uL   Hemoglobin 11.8 11.1 - 15.9 g/dL   Hematocrit 35.0 34.0 - 46.6 %   MCV 89 79 - 97 fL   MCH 30.1 26.6 - 33.0 pg   MCHC 33.7 31.5 - 35.7 g/dL   RDW 13.0 11.7 - 15.4 %   Platelets 309 150 - 450 x10E3/uL   Neutrophils 67 Not Estab. %   Lymphs 26 Not Estab. %   Monocytes 5 Not Estab. %   Eos 2 Not Estab. %   Basos 0 Not Estab. %   Neutrophils Absolute 5.3 1.4 - 7.0 x10E3/uL   Lymphocytes  Absolute 2.0 0.7 - 3.1 x10E3/uL   Monocytes Absolute 0.4 0.1 - 0.9 x10E3/uL   EOS (ABSOLUTE) 0.2 0.0 - 0.4 x10E3/uL   Basophils Absolute 0.0 0.0 - 0.2 x10E3/uL   Immature Granulocytes 0 Not Estab. %   Immature Grans (Abs) 0.0 0.0 - 0.1 x10E3/uL  Comp Met (CMET)  Result Value Ref Range   Glucose 86 65 - 99 mg/dL   BUN 13 6 - 24 mg/dL   Creatinine, Ser 0.72 0.57 - 1.00 mg/dL   GFR calc non Af Amer 99 >59 mL/min/1.73   GFR calc Af Amer 115 >59 mL/min/1.73   BUN/Creatinine Ratio 18 9 - 23   Sodium 142 134 - 144 mmol/L   Potassium 4.2 3.5 - 5.2 mmol/L   Chloride 106 96 - 106 mmol/L   CO2 24 20 - 29 mmol/L   Calcium 8.8 8.7 - 10.2 mg/dL   Total Protein 6.2 6.0 - 8.5 g/dL   Albumin 4.0 3.8 - 4.8 g/dL   Globulin, Total 2.2 1.5 - 4.5 g/dL   Albumin/Globulin Ratio 1.8 1.2 - 2.2   Bilirubin Total 0.3 0.0 - 1.2 mg/dL   Alkaline Phosphatase 53 39 - 117 IU/L   AST 14 0 - 40 IU/L   ALT 11 0 - 32 IU/L  HIV antibody (with reflex)  Result Value Ref Range   HIV Screen 4th Generation wRfx Non Reactive Non Reactive  Lipid Panel w/o Chol/HDL Ratio OUT  Result Value Ref Range   Cholesterol, Total 184 100 - 199 mg/dL   Triglycerides 218 (H) 0 - 149 mg/dL   HDL 50 >39 mg/dL   VLDL Cholesterol Cal 37 5 - 40 mg/dL   LDL Chol Calc (NIH) 97 0 - 99 mg/dL  TSH  Result Value Ref Range   TSH 2.860 0.450 - 4.500 uIU/mL  UA/M w/rflx Culture, Routine   Specimen: Blood   BLD  Result Value Ref Range   Specific Gravity, UA 1.020 1.005 - 1.030   pH, UA 7.0 5.0 - 7.5   Color, UA Yellow Yellow   Appearance Ur Hazy (A) Clear   Leukocytes,UA Trace (A) Negative   Protein,UA Negative Negative/Trace   Glucose, UA Negative Negative   Ketones, UA Negative Negative   RBC, UA Negative Negative   Bilirubin, UA Negative Negative   Urobilinogen, Ur 0.2 0.2 - 1.0 mg/dL   Nitrite, UA Negative Negative   Microscopic Examination See below:    Urinalysis Reflex Comment   Bayer DCA Hb A1c  Waived  Result Value Ref  Range   HB A1C (BAYER DCA - WAIVED) 5.1 <7.0 %      Assessment & Plan:   Problem List Items Addressed This Visit    None    Visit Diagnoses    Dysuria    -  Primary   UA clear. Discussed gentle vaginal cleaning. If not better with increasing fluids in next couple of days, will call and we'll send in an abx.    Relevant Orders   UA/M w/rflx Culture, Routine       Follow up plan: Return if symptoms worsen or fail to improve.

## 2020-01-26 ENCOUNTER — Encounter: Payer: Self-pay | Admitting: Family Medicine

## 2020-01-28 MED ORDER — CIPROFLOXACIN HCL 500 MG PO TABS: 500.0000 mg | ORAL_TABLET | Freq: Two times a day (BID) | ORAL | 0 refills | Status: AC

## 2020-04-26 ENCOUNTER — Ambulatory Visit: Payer: BC Managed Care – PPO | Admitting: Family Medicine

## 2020-05-16 ENCOUNTER — Ambulatory Visit: Payer: BC Managed Care – PPO | Admitting: Family Medicine

## 2020-09-26 ENCOUNTER — Encounter: Payer: BC Managed Care – PPO | Admitting: Family Medicine
